# Patient Record
Sex: Female | Born: 1955 | Race: White | Hispanic: No | Marital: Single | Smoking: Former smoker
Health system: Southern US, Community
[De-identification: ages and names within clinical notes are randomized; demographics above are authoritative.]

## PROBLEM LIST (undated history)

## (undated) DIAGNOSIS — I219 Acute myocardial infarction, unspecified: Secondary | ICD-10-CM

## (undated) DIAGNOSIS — G4730 Sleep apnea, unspecified: Secondary | ICD-10-CM

## (undated) DIAGNOSIS — R42 Dizziness and giddiness: Secondary | ICD-10-CM

## (undated) DIAGNOSIS — J189 Pneumonia, unspecified organism: Secondary | ICD-10-CM

## (undated) DIAGNOSIS — K760 Fatty (change of) liver, not elsewhere classified: Secondary | ICD-10-CM

## (undated) DIAGNOSIS — J449 Chronic obstructive pulmonary disease, unspecified: Secondary | ICD-10-CM

## (undated) DIAGNOSIS — I10 Essential (primary) hypertension: Secondary | ICD-10-CM

## (undated) DIAGNOSIS — J3089 Other allergic rhinitis: Secondary | ICD-10-CM

## (undated) DIAGNOSIS — M1990 Unspecified osteoarthritis, unspecified site: Secondary | ICD-10-CM

## (undated) DIAGNOSIS — K2970 Gastritis, unspecified, without bleeding: Secondary | ICD-10-CM

## (undated) DIAGNOSIS — G4733 Obstructive sleep apnea (adult) (pediatric): Secondary | ICD-10-CM

## (undated) DIAGNOSIS — Z8719 Personal history of other diseases of the digestive system: Secondary | ICD-10-CM

## (undated) DIAGNOSIS — F419 Anxiety disorder, unspecified: Secondary | ICD-10-CM

## (undated) DIAGNOSIS — D539 Nutritional anemia, unspecified: Secondary | ICD-10-CM

## (undated) DIAGNOSIS — L0390 Cellulitis, unspecified: Secondary | ICD-10-CM

## (undated) DIAGNOSIS — Z9989 Dependence on other enabling machines and devices: Secondary | ICD-10-CM

## (undated) DIAGNOSIS — K449 Diaphragmatic hernia without obstruction or gangrene: Secondary | ICD-10-CM

## (undated) DIAGNOSIS — F32A Depression, unspecified: Secondary | ICD-10-CM

## (undated) DIAGNOSIS — G43909 Migraine, unspecified, not intractable, without status migrainosus: Secondary | ICD-10-CM

## (undated) DIAGNOSIS — I2510 Atherosclerotic heart disease of native coronary artery without angina pectoris: Secondary | ICD-10-CM

## (undated) DIAGNOSIS — D649 Anemia, unspecified: Secondary | ICD-10-CM

## (undated) DIAGNOSIS — E119 Type 2 diabetes mellitus without complications: Secondary | ICD-10-CM

## (undated) DIAGNOSIS — R0602 Shortness of breath: Secondary | ICD-10-CM

## (undated) DIAGNOSIS — J45909 Unspecified asthma, uncomplicated: Secondary | ICD-10-CM

## (undated) DIAGNOSIS — K649 Unspecified hemorrhoids: Secondary | ICD-10-CM

## (undated) DIAGNOSIS — K219 Gastro-esophageal reflux disease without esophagitis: Secondary | ICD-10-CM

## (undated) DIAGNOSIS — M109 Gout, unspecified: Secondary | ICD-10-CM

## (undated) DIAGNOSIS — I209 Angina pectoris, unspecified: Secondary | ICD-10-CM

## (undated) DIAGNOSIS — I639 Cerebral infarction, unspecified: Secondary | ICD-10-CM

## (undated) DIAGNOSIS — F329 Major depressive disorder, single episode, unspecified: Secondary | ICD-10-CM

## (undated) DIAGNOSIS — E785 Hyperlipidemia, unspecified: Secondary | ICD-10-CM

## (undated) HISTORY — PX: ERCP: SHX60

## (undated) HISTORY — PX: UPPER GI ENDOSCOPY: SHX6162

## (undated) HISTORY — DX: Atherosclerotic heart disease of native coronary artery without angina pectoris: I25.10

## (undated) HISTORY — PX: KNEE ARTHROSCOPY W/ MENISCECTOMY: SHX1879

## (undated) HISTORY — PX: DILATION AND CURETTAGE OF UTERUS: SHX78

## (undated) HISTORY — PX: SEPTOPLASTY: SUR1290

## (undated) HISTORY — DX: Obstructive sleep apnea (adult) (pediatric): G47.33

## (undated) HISTORY — PX: TONSILLECTOMY AND ADENOIDECTOMY: SUR1326

## (undated) HISTORY — DX: Dependence on other enabling machines and devices: Z99.89

## (undated) HISTORY — PX: CHOLECYSTECTOMY: SHX55

## (undated) HISTORY — PX: NASAL SINUS SURGERY: SHX719

## (undated) HISTORY — PX: DILATION AND CURETTAGE, DIAGNOSTIC / THERAPEUTIC: SUR384

---
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
## 1979-02-20 DIAGNOSIS — I219 Acute myocardial infarction, unspecified: Secondary | ICD-10-CM

## 1979-02-20 HISTORY — DX: Acute myocardial infarction, unspecified: I21.9

## 1979-02-20 HISTORY — PX: TONSILLECTOMY AND ADENOIDECTOMY: SUR1326

## 1989-02-19 HISTORY — PX: SEPTOPLASTY: SUR1290

## 1997-01-02 HISTORY — PX: CORONARY ARTERY BYPASS GRAFT: SHX141

## 1998-02-25 ENCOUNTER — Encounter: Payer: Self-pay | Admitting: Cardiovascular Disease

## 1998-02-25 ENCOUNTER — Observation Stay (HOSPITAL_COMMUNITY): Admission: AD | Admit: 1998-02-25 | Discharge: 1998-02-26 | Payer: Self-pay | Admitting: Cardiovascular Disease

## 1998-03-17 HISTORY — PX: CORONARY ANGIOPLASTY WITH STENT PLACEMENT: SHX49

## 1998-12-31 ENCOUNTER — Observation Stay (HOSPITAL_COMMUNITY): Admission: AD | Admit: 1998-12-31 | Discharge: 1999-01-01 | Payer: Self-pay | Admitting: Cardiovascular Disease

## 1998-12-31 HISTORY — PX: CARDIAC CATHETERIZATION: SHX172

## 2001-07-21 HISTORY — PX: CARDIAC CATHETERIZATION: SHX172

## 2002-09-24 ENCOUNTER — Encounter: Payer: Self-pay | Admitting: Cardiology

## 2002-09-24 ENCOUNTER — Inpatient Hospital Stay (HOSPITAL_COMMUNITY): Admission: AD | Admit: 2002-09-24 | Discharge: 2002-09-26 | Payer: Self-pay | Admitting: Cardiology

## 2002-09-25 HISTORY — PX: CARDIAC CATHETERIZATION: SHX172

## 2002-09-26 ENCOUNTER — Encounter: Payer: Self-pay | Admitting: Cardiology

## 2004-07-16 ENCOUNTER — Ambulatory Visit: Payer: Self-pay

## 2004-08-28 ENCOUNTER — Inpatient Hospital Stay: Payer: Self-pay

## 2004-11-20 HISTORY — PX: CARDIAC CATHETERIZATION: SHX172

## 2005-06-09 ENCOUNTER — Ambulatory Visit: Payer: Self-pay

## 2005-10-07 ENCOUNTER — Inpatient Hospital Stay (HOSPITAL_COMMUNITY): Admission: EM | Admit: 2005-10-07 | Discharge: 2005-10-09 | Payer: Self-pay | Admitting: Cardiovascular Disease

## 2005-10-07 ENCOUNTER — Emergency Department: Payer: Self-pay | Admitting: Emergency Medicine

## 2005-10-08 HISTORY — PX: CARDIAC CATHETERIZATION: SHX172

## 2005-10-19 ENCOUNTER — Ambulatory Visit (HOSPITAL_COMMUNITY): Admission: RE | Admit: 2005-10-19 | Discharge: 2005-10-20 | Payer: Self-pay | Admitting: Cardiovascular Disease

## 2005-10-19 HISTORY — PX: CORONARY ANGIOPLASTY WITH STENT PLACEMENT: SHX49

## 2005-12-21 ENCOUNTER — Ambulatory Visit (HOSPITAL_COMMUNITY): Admission: RE | Admit: 2005-12-21 | Discharge: 2005-12-21 | Payer: Self-pay | Admitting: Cardiology

## 2005-12-21 HISTORY — PX: CARDIAC CATHETERIZATION: SHX172

## 2006-07-20 ENCOUNTER — Ambulatory Visit: Payer: Self-pay

## 2007-01-03 ENCOUNTER — Ambulatory Visit: Payer: Self-pay

## 2007-05-23 ENCOUNTER — Ambulatory Visit: Payer: Self-pay | Admitting: General Practice

## 2007-07-10 ENCOUNTER — Ambulatory Visit: Payer: Self-pay | Admitting: General Practice

## 2007-11-28 ENCOUNTER — Ambulatory Visit: Payer: Self-pay

## 2008-01-04 ENCOUNTER — Inpatient Hospital Stay (HOSPITAL_COMMUNITY): Admission: AD | Admit: 2008-01-04 | Discharge: 2008-01-05 | Payer: Self-pay | Admitting: Cardiovascular Disease

## 2008-01-04 HISTORY — PX: CARDIAC CATHETERIZATION: SHX172

## 2008-07-16 ENCOUNTER — Emergency Department (HOSPITAL_COMMUNITY): Admission: EM | Admit: 2008-07-16 | Discharge: 2008-07-16 | Payer: Self-pay | Admitting: Emergency Medicine

## 2008-10-23 DIAGNOSIS — I2510 Atherosclerotic heart disease of native coronary artery without angina pectoris: Secondary | ICD-10-CM

## 2008-10-23 HISTORY — DX: Atherosclerotic heart disease of native coronary artery without angina pectoris: I25.10

## 2008-12-02 ENCOUNTER — Ambulatory Visit: Payer: Self-pay | Admitting: Internal Medicine

## 2009-02-13 ENCOUNTER — Ambulatory Visit: Payer: Self-pay | Admitting: Gastroenterology

## 2009-02-21 ENCOUNTER — Ambulatory Visit: Payer: Self-pay | Admitting: Gastroenterology

## 2009-02-28 ENCOUNTER — Other Ambulatory Visit: Payer: Self-pay | Admitting: Gastroenterology

## 2009-05-22 ENCOUNTER — Ambulatory Visit: Payer: Self-pay

## 2009-07-05 ENCOUNTER — Emergency Department: Payer: Self-pay | Admitting: Emergency Medicine

## 2009-08-15 ENCOUNTER — Ambulatory Visit: Payer: Self-pay | Admitting: Emergency Medicine

## 2009-10-06 ENCOUNTER — Ambulatory Visit: Payer: Self-pay | Admitting: Cardiovascular Disease

## 2009-10-07 ENCOUNTER — Other Ambulatory Visit: Payer: Self-pay | Admitting: Cardiovascular Disease

## 2009-10-13 ENCOUNTER — Ambulatory Visit (HOSPITAL_COMMUNITY): Admission: RE | Admit: 2009-10-13 | Discharge: 2009-10-14 | Payer: Self-pay | Admitting: Cardiovascular Disease

## 2009-10-13 HISTORY — PX: CARDIAC CATHETERIZATION: SHX172

## 2010-01-21 ENCOUNTER — Ambulatory Visit: Payer: Self-pay | Admitting: Surgery

## 2010-01-22 LAB — PATHOLOGY REPORT

## 2010-03-04 ENCOUNTER — Ambulatory Visit: Payer: Self-pay | Admitting: Surgery

## 2010-03-19 ENCOUNTER — Ambulatory Visit: Payer: Self-pay | Admitting: Gastroenterology

## 2010-05-21 ENCOUNTER — Other Ambulatory Visit: Payer: Self-pay | Admitting: Gastroenterology

## 2010-05-26 ENCOUNTER — Ambulatory Visit: Payer: Self-pay | Admitting: Gastroenterology

## 2010-07-09 ENCOUNTER — Ambulatory Visit: Payer: Self-pay

## 2010-08-19 ENCOUNTER — Inpatient Hospital Stay: Payer: Self-pay | Admitting: Internal Medicine

## 2010-09-10 ENCOUNTER — Other Ambulatory Visit: Payer: Self-pay | Admitting: Radiology

## 2010-09-11 ENCOUNTER — Ambulatory Visit: Payer: Self-pay | Admitting: Neurology

## 2010-10-05 LAB — POCT CARDIAC MARKERS
CKMB, poc: <1 ng/mL — ABNORMAL LOW (ref 1.0–8.0)
CKMB, poc: <1 ng/mL — ABNORMAL LOW (ref 1.0–8.0)
Myoglobin, poc: 63.5 ng/mL (ref 12–200)
Troponin i, poc: <0.05 ng/mL (ref 0.00–0.09)
Troponin i, poc: <0.05 ng/mL (ref 0.00–0.09)

## 2010-10-05 LAB — BASIC METABOLIC PANEL
BUN: 6 mg/dL (ref 6–23)
CO2: 29 mEq/L (ref 19–32)
Calcium: 8 mg/dL — ABNORMAL LOW (ref 8.4–10.5)
Chloride: 96 mEq/L (ref 96–112)
Creatinine, Ser: 0.78 mg/dL (ref 0.4–1.2)
GFR calc Af Amer: >60 mL/min (ref >60)
GFR calc non Af Amer: >60 mL/min (ref >60)
Glucose, Bld: 124 mg/dL — ABNORMAL HIGH (ref 70–99)
Potassium: 2.5 mEq/L — CL (ref 3.5–5.1)
Sodium: 137 mEq/L (ref 135–145)

## 2010-10-05 LAB — CBC
HCT: 38 % (ref 36.0–46.0)
Hemoglobin: 13.1 g/dL (ref 12.0–15.0)
MCHC: 34.5 g/dL (ref 30.0–36.0)
MCV: 94.9 fL (ref 78.0–100.0)
Platelets: 221 10*3/uL (ref 150–400)
RBC: 4.01 MIL/uL (ref 3.87–5.11)
RDW: 13.3 % (ref 11.5–15.5)
WBC: 8.3 10*3/uL (ref 4.0–10.5)

## 2010-10-05 LAB — DIFFERENTIAL
Basophils Relative: 1 % (ref 0–1)
Eosinophils Relative: 3 % (ref 0–5)
Monocytes Absolute: 0.6 10*3/uL (ref 0.1–1.0)
Monocytes Relative: 7 % (ref 3–12)
Neutro Abs: 5.9 10*3/uL (ref 1.7–7.7)

## 2010-11-03 NOTE — Discharge Summary (Signed)
Rita Morgan, Rita [name]:  [id number]   MEDICAL RECORD NO.:  [id number]          PATIENT TYPE:  INP   LOCATION:  3735                         FACILITY:  [facility]   PHYSICIAN:  Nanetta Batty, M.D.   DATE OF BIRTH:  10/06/1955   DATE OF ADMISSION:  01/04/2008  DATE OF DISCHARGE:  01/05/2008                               DISCHARGE SUMMARY   DISCHARGE DIAGNOSES:  1. Chest discomfort with abnormal Myoview and this cardiac      catheterization revealed patent grafts and patent stents,      noncardiac source of chest pain most likely.  2. Coronary artery disease with history of coronary artery bypass      grafting in 99 with LIMA to the diagonal, vein graft to the RCA and      interventions since that time.  3. Severe gastroesophageal reflux disease.  4. Hypertension.  5. Hyperlipidemia.  6. Chronic obstructive pulmonary disease with reactive airway disease.  7. Obstructive sleep apnea, no longer wears a continuous positive      airway pressure.   DISCHARGE CONDITION:  Improved.   PROCEDURES:  Combined left heart catheterization with graft  visualization on January 05, 2008, revealing patent vessels.   ALLERGIES:  CARDIZEM, TROVAN, and PREDNISONE.   DISCHARGE MEDICATIONS:  1. Toprol-XL 100 mg daily.  2. Prevacid 30 mg every 12 hours.  3. Plavix 75 mg daily.  4. Aspirin 81 mg daily.  5. Torsemide 40 mg every morning.  6. KCl 20 mEq twice a day.  7. Lipitor 80 mg daily.  8. Zoloft 100 mg daily at bedtime.  9. Singulair 10 mg at bedtime.  10.Advair 250/50 inhalations every 12 hours.  11.Atrovent 2 puffs p.r.n. every 4 hours.  12.Albuterol inhaler 2 puffs q. 4 h. p.r.n.  13.Lovaza 2 g twice a day.  14.Multivitamin daily.  15.Creon 10 four capsules before meals and at bedtime.  16.Alprazolam 0.5 mg q.8 h. p.r.n.  17.Ibuprofen 800 mg p.o. q.8 h p.r.n.  18.Tramadol 50-100 mg every 6 hours.  19.Zolpidem 10 mg at bedtime p.r.n.  20.Diphenhydramine 50 mg q.4 h.  p.r.n.  21.Diphenoxylate and atropine every 4 hours p.r.n.  22.Loperamide 2 mg q.4 h. p.r.n.  23.Promethazine 25 mg every 4 hours p.r.n.  24.Tylenol No. 3 one to two every 4 hours p.r.n.#  25.DuoNebs every 4 hours p.r.n..   DISCHARGE INSTRUCTIONS:  1. Increase activity slowly.  May shower or bathe.  No lifting for 2      days.  No driving for 2 days.  May shower beginning on January 06, 2008.  2. Low-sodium heart-healthy diet.  3. Wash cath site with soap and water.  Call if any bleeding, swelling      or drainage.  4. Follow up with Dr. Allyson Sabal January 23, 2008, at 2:00 p.m.  5. Follow up with Dr. Hyacinth Meeker for further chest discomfort.  6. Follow up with Dr. Kinnie Scales.   HISTORY OF PRESENT ILLNESS:  A 55 year old moderately overweight single  white female with history of coronary artery disease with grafting to a  course in  1998 multiple interventions with a stent to RCA vein graft  using Cypher drug-eluting stents in 1997.  Other problems are  hypertension, hyperlipidemia, obstructive sleep apnea, no longer on  CPAP.  She had a stress Myoview November 27, 2007, with subtle anterolateral  ischemia and diaphragmatic attenuation read at low risk but she had a  fairly severe episode of pain with diaphoresis in the last couple of  days with some mild chest pain since that time.   Review of systems, family history, social history, see Dr. Hazle Coca  notes.   PHYSICAL EXAM:  Heart, regular rate and rhythm.  Lungs were clear.  Groin cath site stable.  No hematoma.   LABORATORY DATA:  Currently not available.   HOSPITAL COURSE:  Rita Morgan was admitted by Dr. Allyson Sabal secondary to  abnormal nuclear study and an episode of significant chest pain with  diaphoresis and continued mild chest discomfort.  She was admitted to  [facility] with IV heparin and underwent diagnostic angiography January 05, 2008,  by Dr. Elsie Lincoln.  Fortunately, the cath looks good and it was felt she  could be discharged once she  recovered from post cath orders such as  lying flat.  She will follow up with Dr. Hazle Coca recommendations to see  primary care and Dr. Kinnie Scales for further chest discomfort.      Darcella Gasman. Annie Paras, N.P.       ______________________________  Nanetta Batty, M.D.    LRI/MEDQ  D:  01/05/2008  T:  01/06/2008  Job:  [id number]   cc:   Bethann Punches

## 2010-11-03 NOTE — Op Note (Signed)
Rita Morgan, Rita Morgan               ACCOUNT NO.:  [id number]   MEDICAL RECORD NO.:  [id number]          PATIENT TYPE:  INP   LOCATION:  3735                         FACILITY:  [facility]   PHYSICIAN:  Madaline Savage, M.D.DATE OF BIRTH:  1956/06/10   DATE OF PROCEDURE:  01/05/2008  DATE OF DISCHARGE:  01/05/2008                               OPERATIVE REPORT   PROCEDURES PERFORMED:  1. Selective coronary angiography by Judkins' technique.  2. Retrograde left heart catheterization.  3. Left ventricular angiography.  4. Selective visualization of a right internal mammary artery graft.  5. Selective visualization of one saphenous vein bypass graft.   INTERVENTIONS:  None.   COMPLICATIONS:  None.   PATIENT PROFILE:  The patient is a 55 year old obese female with a  history of coronary artery disease and bypass grafting in 1998 with  subsequent multiple coronary interventions.  One was to her right  coronary artery vein graft using a Cypher drug-eluting stent, which was  done in May 2007.  She has a history of hypertension, hyperlipidemia,  and obstructive sleep apnea.  Dr. Nanetta Batty, her primary  cardiologist saw her recently and obtained a Myoview stress test, which  was done on November 27, 2007, and it showed subtle anterolateral ischemic  and diaphragmatic continuation, which was read as low risk.  The patient  had a fairly severe episode of chest pain with diaphoresis in the last  couple of days and some mid chest pain since that time and therefore the  current cardiac catheterization was completed.  No complications  occurred.  It was done by the right percutaneous femoral approach using  5-French diagnostic catheters.   RESULTS:  Pressures:  The left ventricular pressure was a 110/65 with a  mean of 85.  The left ventricular pressure was 110/7 with an end-  diastolic  pressure of 16.  There was no significant aortic valve  gradient via the pull-back technique.   ANGIOGRAPHIC  RESULTS:  The patient's left main coronary artery was  medium in length, showed no evidence of calcification, and no evidence  of stenosis.  The left anterior descending coronary artery was close to  the cardiac apex giving rise to 1 major septal perforator branch and 2  tiny diagonal branches.  There was a third diagonal branch, which had a  takeoff well beyond the first septal perforator branch and that vessel  is essentially occluded, but as will be described later, it has a patent  free right internal mammary artery graft inserted into it.  The LAD  itself contains no significant lesions, but is a relatively small  vessel.   The left circumflex coronary artery is a somewhat dominant vessel  showing some lumpy-bumpy irregularities and ectasia in the proximal  segment of the vessel.  There is an ongoing circumflex that courses  through the atrioventricular groove then giving rise to a posterolateral  branch, and there are only lumpy-bumpy irregularities throughout the  course of the proximal circumflex and its mid and distal circumflex.  There is a very large and dominant obtuse marginal branch that contains  a  radiopaque stent just in the proximal portion of its takeoff from the  circumflex, and this vessel gives rise to 4 distal branches, all of  which appear to be patent.  As previously stated, the stent located  approximately in this obtuse marginal branch is widely patent.   The right coronary artery is a severely diseased vessel showing impaired  flow from the ostium down to the mid portion of vessel.  There are 2  different radiopaque stents, which overlap the ostium of the RCA and  then the proximal RCA and then the vessel itself becomes occluded well  after the 2 patent stents that have about a 50% in-stent restenosis.   There is an adjacent saphenous vein graft marker and a saphenous vein  graft that is widely patent showing lumpy-bumpy irregularities in the  mid portion of  the vein graft, but shows good anastomotic site  morphology with good flow into a posterior descending branch.   Finally, there is a free right internal mammary artery that has multiple  surgical clips on it that is sewn into the diagonal, which arises mid  way down the LAD, that RIMA graft and its recipient diagonal is widely  patent with good flow measured as TIMI 3.  No lesions were seen.   The left ventricular angiography performed in a 30-degree RAO projection  shows no evidence of mitral regurgitation, shows good wall motion in all  views, and ascending aorta is unremarkable.   FINAL IMPRESSIONS:  1. Recent chest pain.  Recent Myoview showing some subtle changes.  2. Normal left ventricular systolic function by left ventricular      angiography.  3. Normal left main coronary artery.  4. Left anterior descending artery with patent stent located in the      mid portion of the left anterior descending artery with good distal      flow.  5. Occluded diagonal proximally, but a free right internal mammary      artery that inserts into the diagonal is widely patent.  6. Lumpy-bumpy irregularities in a dominant circumflex vessel with an      obtuse marginal branch containing a patent stent proximally.  7. Severe disease involving the right coronary artery, which is      occluded mid way down the vessel, and also showing 50% in-stent      restenosis at the ostium of the right coronary artery and in the      proximal right coronary artery.  8. Patent saphenous vein graft to the distal right coronary artery.      There is also collateral flow from left anterior descending artery      to distal right coronary artery.   PLAN:  The patient is to be reassured about her anatomic findings.  She  will be a candidate for discharge later today.  She will follow up with  the excellent care of Dr. Nanetta Batty at [facility].            ______________________________  Madaline Savage, M.D.     WHG/MEDQ  D:  01/05/2008  T:  01/06/2008  Job:  [id number]   cc:   Nanetta Batty, M.D.  Fax: [phone]   [facility]

## 2010-11-06 NOTE — Cardiovascular Report (Signed)
NAMEALGA, SOUTHALL NO.:  [id number]   MEDICAL RECORD NO.:  [id number]          PATIENT TYPE:  OIB   LOCATION:  2899                         FACILITY:  [facility]   PHYSICIAN:  Thereasa Solo. Little, M.D. DATE OF BIRTH:  1955-08-19   DATE OF PROCEDURE:  12/21/2005  DATE OF DISCHARGE:                              CARDIAC CATHETERIZATION   INDICATION FOR TEST:  This 55 year old female had bypass surgery.  She on  Oct 19, 2005 had a stent placed to the saphenous vein graft to her RCA.  She  presented to the office on December 20, 2005 after having prolonged episode of  chest pain relieved with nitrates.  She is brought in for outpatient cardiac  catheterization.   DESCRIPTION OF PROCEDURE:  After obtaining informed consent the patient was  prepped and draped in the usual sterile fashion exposing the right groin.  Applying local anesthetic with 1% Xylocaine with Seldinger technique  employed, a 5-French introducer sheath was placed in the right femoral  artery.  Left and right coronary arteriography, graft visualization x2 and  ventriculography in the RAO projection was performed.   COMPLICATIONS:  None.   EQUIPMENT:  Five [place] Judkins configuration catheters and all grafts were  visualized with a JR-4 catheter.   HEMODYNAMIC MONITORING:  Central aortic pressure was 112/66.  Left  ventricular pressure was 116/2 and there was no valve gradient noted at the  time of pullback.   VENTRICULOGRAPHY:  Ventriculography in the RAO projection revealed normal LV  systolic function.  Ejection fraction greater than 55%.  The left  ventricular end-diastolic pressure was 12.   CORONARY ARTERIOGRAPHY:  1.  Left main normal.  It bifurcated.  2.  LAD.  This was a small vessel free of disease.  The diagonal vessel was      not visualized but was grafted.  3.  Circumflex.  The circumflex gave rise to 2 OM vessels which were free of      disease.  The mid circumflex had 30-40% areas of  narrowing and this is      unchanged from her last cath October 08, 2005.  4.  RCA.  The RCA is diffusely diseased.  The stent in the proximal portion      has high-grade stenoses.  This is also unchanged from her October 08, 2005      cath.   GRAFTS:  1.  The free right internal mammary artery to the diagonal was widely      patent.  The diagonal itself was widely patent.  2.  Saphenous vein graft to the RCA.  The graft was widely patent.  The      stent in the midportion was widely patent.  The PDA was well visualized      and free of disease.   CONCLUSION:  1.  Normal left ventricular systolic function.  2.  Patent grafts with the stent in the saphenous vein graft to the      posterior descending widely patent.  3.  Mild disease in her circumflex which is unchanged and a diffusely  diseased right coronary artery that is grafted.           ______________________________  Thereasa Solo. Little, M.D.     ABL/MEDQ  D:  12/21/2005  T:  12/21/2005  Job:  [id number]

## 2010-11-06 NOTE — Cardiovascular Report (Signed)
NAME:  Rita Morgan, Rita Morgan                         ACCOUNT NO.:  [id number]   MEDICAL RECORD NO.:  [id number]                   PATIENT TYPE:  INP   LOCATION:  2003                                 FACILITY:  [facility]   PHYSICIAN:  Madaline Savage, M.D.             DATE OF BIRTH:  23-Jan-1956   DATE OF PROCEDURE:  09/25/2002  DATE OF DISCHARGE:                              CARDIAC CATHETERIZATION   PROCEDURES PERFORMED:  1. Selective coronary angiography via the native coronary circulation.  2. Retrograde left heart catheterization.  3. Left ventriculography.  4. Aortic root angiography.  5. Selective visualization of a free right internal mammary graft to     diagonal and a saphenous vein graft to right coronary artery.   COMPLICATIONS:  None.   ENTRY SITE:  Right femoral.   DYE USED:  Omnipaque.   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 Allyson Sabal.  She has  had bypass grafting in the past and has had a stent placed to her proximal  obtuse marginal branch of the circumflex coronary artery.  She has had  recurrence of angina recently and was admitted yesterday for unstable angina  that is nitrate responsive.  She has ruled out for myocardial infarction.  She enters the catheterization lab to re-evaluate coronary anatomy in view  of symptoms.   RESULTS:  PRESSURES:  The left ventricular pressure was 94/3, end-diastolic  pressure 13.  Central aortic pressure was 94/64, with a mean of 100.  These  pressures were obtained with the patient on 6 mL of IV nitroglycerin an  hour.   ANGIOGRAPHIC RESULTS:  1. The left main coronary artery is medium in length, large in     circumference, and widely patent.  2. The left anterior descending coronary artery courses through the cardiac     apex and has minimal luminal irregularities, but is widely patent     throughout its course.  One large septal  perforator branch is also     normal.  There appears to be an occlusion of a diagonal branch, possibly     a second diagonal at its takeoff from the LAD.  3. The left circumflex coronary is a vessel of large circumference which     contains a very large obtuse marginal branch.  That OM branch bifurcates     distally.  It has a patent stent which appears well deployed and widely     patent throughout the proximal circumflex.  The atrioventricular groove     circumflex bifurcates distally.  The vessel tapers abruptly distally but     no lesions are seen.  4. The right coronary artery contains a radioopaque stent proximal and this     stent extends from the ostium down to a branch of the pulmonary conus     branch of  the RCA.  This stent is basically patent.  There may be some     mild in-stent restenosis, but it is insignificant.  The RCA is 100%     occluded in its mid portion after an acute marginal branch.  5. The vein graft to the RCA is widely patent throughout, and it touches     down to the mid posterior descending branch.  There is good flow distally     in the PDA and proximally, and we note filling of the PLA as well.  6. The free right internal mammary artery graft to the diagonal branch of     the LAD is widely patent throughout its course, and runoff is good.     There are some luminal irregularities of the ostium of this graft but     none of any significance.  7. The left ventricular angiogram shows good contractility of the ventricle     with an ejection fraction estimate of 55% to 60% with no mitral     regurgitation.  8. Aortic root angiography shows a smooth contour to the ascending aorta and     the descending aorta as well.   FINAL DIAGNOSES:  1. Coronary artery disease, status post bypass grafting.     A. Patent right saphenous vein graft to posterior descending artery.     B. Patent free right internal mammary artery graft to diagonal.  2. Normal left ventricular  systolic function.  3. A 100% occlusion of mid RCA and 100% occlusion of the proximal second     diagonal.  4. Normal aortic root arteriography.   RECOMMENDATIONS:  The patient's chest pain is nitrate responsive and is  clinically angina, however, no epicardial coronary artery disease or graft  disease is found to explain those symptoms.  Possibilities include  microvascular angina or possible GI pathology.                                               Madaline Savage, M.D.    WHG/MEDQ  D:  09/25/2002  T:  09/26/2002  Job:  [id number]   cc:   Nanetta Batty, M.D.  [address], [address]  [place]  [place] [address]  Fax: [phone]   Cath Lab at [facility]

## 2010-11-06 NOTE — Cardiovascular Report (Signed)
NAMEISAAC, [name]:  [id number]   MEDICAL RECORD NO.:  [id number]          PATIENT TYPE:  INP   LOCATION:  2918                         FACILITY:  [facility]   PHYSICIAN:  Rita Priestly, MD  DATE OF BIRTH:  1956-05-02   DATE OF PROCEDURE:  10/08/2005  DATE OF DISCHARGE:                              CARDIAC CATHETERIZATION   PROCEDURES:  1.  Left heart catheterization.  2.  Coronary angiography.  3.  Left ventriculogram.  4.  Saphenous graft angiography.  5.  Right internal mammary angiography.   CARDIOLOGIST:  Lenise Herald, M.D.   COMPLICATIONS:  None.   INDICATIONS FOR PROCEDURE:  Rita Morgan is a 55 year old female patient of  Dr. Bethann Punches, [facility] in [place], Dr. Nanetta Batty with a  history of hypertension, hyperlipidemia, history of coronary artery disease  status post stenting of the LAD circumflex and RCA with ultimate InStent  restenosis of the RCA and diffuse disease of the diagonal. She ultimately  underwent bypass surgery by Dr. Kathlee Nations Trigt, consisting of vein graft to  the PDA as well as a free RIMA to diagonal. She was re-ad[facility] to [facility] on October 07, 2005 with recurrent chest pain with lateral T  wave inversions. She is now brought for repeat catheterization and to rule  out significant coronary artery disease. She did rule out for myocardial  infarction with troponin and CK monitoring.   DESCRIPTION OF PROCEDURE:  After informed written consent, the patient was  brought to the cardiac catheterization lab where her right and left groins  were shaved, prepped, and draped in the usual sterile fashion.  __________.  Using modified Seldinger technique, a #6 French arterial sheath was inserted  into the right femoral artery. A 6 French diagnostic catheter was used to  perform diagnostic angiography.   The left main was a large vessel with no significant disease.   The LAD is a large vessel  that coarse to the apex andgives rise to one  totally occluded diagonal vessel. There is a stent noted in the early mid  portion of the LAD, which appears to be widely patent. There is mild 50%  narrowing of the LAD prior to the LAD stent. The remainder of the LAD has no  significant disease.   The first diagonal is totally occluded in its proximal portion. There is a  free RIMA anastomosed to the mid portion of the diagonal. There is pressure  damping when engaged in the RIMA, however, there does not appear to be any  significant disease within the body of the graft or distal graft insertion.   The left circumflex is a large vessel that courses in the AV groove and give  rise two obtuse marginal branches. The AV groove circumflex is noted to have  sequential 30% lesions in its proximal early mid segment. The remainder of  the AV groove circumflex is small and has no significant disease.   The first OM is a medium to large size vessel, which bifurcates distally.  There is a stent noted in the proximal portion  of the OM, which appears to  be widely patent.   The second OM is a small vessel with no significant disease.   The right coronary artery is a small vessel, which has 2 to 3 stents  overlapping its proximal segment with diffuse InStent restenosis. There is  sequential 99% lesions with a sub-totaled RCA. There is faint filling into  the PDA and posterolateral branch with competitive flow.   The vein graft to the PDA is patent, though there appears to be a long  lesion in the mid portion of the graft, up to 70% throughout the entire mid  segment of the graft. There does not appear to be any significant disease at  the anastomosis or distal to the graft insertion.   The left ventriculogram has a preserved EF of 60%.   HEMODYNAMIC SYSTEMS:  Systemic arterial pressure 106/63, LV systolic  pressure 106/11, LV LVDP 23.   CONCLUSION:  1.  Significant 2 vessel coronary artery  disease.  2.  Patent free right internal mammary artery to the diagonal with no      significant disease in the body of the graft or distal graft insertion.  3.  Patent vein graft to the patent ductus arteriosus with long diffuse 60%      to 70% disease within the mid portion of the graft.  4.  Normal left ventricular systolic function.  5.  Elevated left ventricular pressure.      Rita Priestly, MD  Electronically Signed     RHM/MEDQ  D:  10/08/2005  T:  10/09/2005  Job:  [id number]

## 2010-11-06 NOTE — Discharge Summary (Signed)
NAME:  Rita Morgan, Rita [name]:  [id number]   MEDICAL RECORD NO.:  [id number]                   PATIENT TYPE:  INP   LOCATION:  2003                                 FACILITY:  [facility]   PHYSICIAN:  Madaline Savage, M.D.             DATE OF BIRTH:  10/20/1955   DATE OF ADMISSION:  09/24/2002  DATE OF DISCHARGE:  09/26/2002                                 DISCHARGE SUMMARY   The patient is a 55 year old white female patient of Dr. Nanetta Batty with  a primary medical history of coronary artery disease.  She underwent  coronary artery bypass graft x2 in 7/98 and subsequent stent of her LAD and  circumflex.  Last Cardiolite was 07/26/01.  No ischemia was noted.  She  apparently has developed significant increased chest discomfort, angina  symptoms.  She was taking nitroglycerin to decrease the pain, but she was  not totally pain free.  Thus, it was decided that she should be admitted to  hospital and  put on IV nitroglycerin and IV heparin and she should undergo  cardiac catheterization.  She was seen by Dr. Elsie Lincoln apparently in the  office.  She continued to have chest pain on IV nitroglycerin and heparin.  However, it did respond to increase in nitroglycerin. On 09/25/02, she  underwent cardiac catheterization which showed a free RIMA to a diagonal  that was patent.  Her circumflex was patent.  Her saphenous vein graft to  RCA was patent.  She had 100% RCA.  Proximal RCA stent was patent.  Thus,  a  GI consult was called.  She underwent gallbladder ultrasound that was  apparently negative.  The plan was that she should have a HIDA scan.  This  would be d one as an outpatient because she was considered stable on 09/26/02  for discharge home.  Her blood pressure was 104/63 and 94/74, heart rate 60,  respirations 22, temperature 98.7.   LABS ON DISCHARGE:  Hemoglobin 12.7, hematocrit 37.1.  WBC 9.4, platelets  227.  Sodium 137, potassium 3.6, BUN 7, creatinine  0.8.  Her glucose was 97.  Her right groin was without bruits or hematoma.   OTHER LABS:  TSH was 2.643. CK-MB was negative x3 with troponin negative x3.  AST 20, ALT 18.  Ultrasound showed fatty liver, top normal caliber, common  bile duct no evidence of calculi in the gallbladder.  Chest x-ray showed no  active disease.   DISCHARGE DIAGNOSES:  1. Chest pain without any ischemia noted on cardiac catheterization.  2. Known arteriosclerotic cardiovascular disease with history of coronary     artery bypass graft x2.  3. Gastroesophageal reflux disease.  4. Hypertension.  5. Hyperlipidemia.  6. History of recent pancreatitis.  7. History of sleep apnea.  8. Hyperlipidemia.     Lezlie Octave, [place].P.  Madaline Savage, M.D.    BB/MEDQ  D:  10/24/2002  T:  10/26/2002  Job:  [id number]   cc:   Nanetta Batty, M.D.  [address], [address]  [place]  [place] [address]  Fax: [phone]   Petra Kuba, M.D.  [address], [address]  [place]  [place] [address]  Fax: [phone]   Griffith Citron, M.D.  [facility] [place]  [place] [address]  Fax: [phone]

## 2010-11-06 NOTE — Op Note (Signed)
Rita Morgan, ROYLANCE NO.:  [id number]   MEDICAL RECORD NO.:  [id number]          PATIENT TYPE:  OIB   LOCATION:  6533                         FACILITY:  [facility]   PHYSICIAN:  Nanetta Batty, M.D.   DATE OF BIRTH:  1956-03-09   DATE OF PROCEDURE:  10/19/2005  DATE OF DISCHARGE:                                 OPERATIVE REPORT   PROCEDURE:  PCI and stent.   Ms. Korpi is a 55 year old mildly overweight single white female with  CAD, status post multiple interventions in the past and ultimately coronary  artery bypass grafting in 1999, with a LIMA to a diagonal branch and a vein  graft to the RCA.  Her other problems include severe GERD, hypertension,  hyperlipidemia, COPD with reactive airways disease.  She was catheterized  approximately 1-2 weeks ago and found to have high-grade segmental mid-RCA  vein graft stenosis, but at that time declined intervention because of the  concern over Plavix.  She was seen in the office yesterday with continued  chest pain and presents now for intervention using Angiomax and drug-eluting  stent.   PROCEDURE DESCRIPTION:  The patient was brought to the second floor [facility] cardiac catheterization lab in the postabsorptive state.  She was  premedicated with p.o. Valium and IV Versed as well as IV Phenergan.  Her  right groin was prepped and shaved in the usual sterile fashion.  One  percent Xylocaine was used for local anesthesia.  A 6 French sheath was  inserted into the right femoral artery using standard Seldinger technique.  A 6 French sheath was then inserted into the right femoral vein.  The  patient was on aspirin and Plavix as an outpatient and received an  additional 150 mg of p.o. Plavix prior to intervention as well as Angiomax  bolus, with an ending ACT of 332.   Using a 6 [place] JR-4 guide along with an EZ FilterWire and a 2.5 x 28  Cypher drug-eluting stent, distal protecting was performed, and direct  stenting subsequent to that at 16 atmospheres (2.61 mm) for 30 seconds.  There was sluggish flow prior to removal of the filter.  The patient  received 200 mcg of intracoronary nitroglycerin several times during the  case, with restitution of brisk TIMI III flow.  There were no filling  defects in the distal right, and the patient remained hemodynamically and  clinically stable.  The FilterWire was then withdrawn using the retrieval  sheath, and completion angiography was performed.  The patient tolerated the  procedure well.  There were no hemodynamic or electrocardiographic sequelae.  The guide wire and catheter were removed, and the sheaths were  sewn securely in place.  The patient left the lab in stable condition.  The  sheaths will be removed in 2 hours.  She will remain supine for 6 hours  subsequent to that.  She will be treated with aspirin and Plavix and be  discharged home in the morning if she remains clinically stable overnight.  She left the lab in stable condition.      Christiane Ha  Allyson Sabal, M.D.  Electronically Signed     JB/MEDQ  D:  10/19/2005  T:  10/19/2005  Job:  [id number]   cc:   [facility]  Second Floor   [facility]  [address]  [place] [address]   Bethann Punches  Fax: [phone]

## 2010-11-06 NOTE — Discharge Summary (Signed)
Rita Morgan, Rita Morgan               ACCOUNT NO.:  [id number]   MEDICAL RECORD NO.:  [id number]          PATIENT TYPE:  INP   LOCATION:  4738                         FACILITY:  [facility]   PHYSICIAN:  Raymon Mutton, P.A. DATE OF BIRTH:  04-23-1956   DATE OF ADMISSION:  10/07/2005  DATE OF DISCHARGE:                                 DISCHARGE SUMMARY   DISCHARGE DIAGNOSES:  1.  Chest pain, unstable angina pectoris, status post catheterization during      this admission.  No intervention performed.  2.  Known coronary artery disease, status post coronary artery bypass graft.  3.  Hypertension.  4.  Hyperlipidemia.  5.  Gastroesophageal reflux disease.  6.  Sleep apnea.  7.  History of pancreatitis.   HOSPITAL COURSE:  This is a 55 year old Caucasian woman with a prior history  of coronary artery disease and coronary artery bypass graft surgery,  approximately eight years ago, who presented to the emergency room at  [facility] with complaints of chest pain and shortness of breath.  The symptoms started as back pain and then radiated to the front.  She took  nitroglycerin with relief of pain, but later, the pain returned at it was  mainly in the front with pressure and tightness and also, she experienced  left neck and jaw pain and shortness of breath.  She was started on heparin  and nitroglycerin drip in [facility] and transferred to [facility] for further evaluation.  Dr. Allyson Sabal saw the patient on admission.  She was admitted on rule-out MI protocol and she wanted her to undergo the  catheterization the next day.   HOSPITAL PROCEDURES:  Cardiac catheterization, performed on October 08, 2005,  done by Dr. Jenne Campus.  The cath showed diseased SVG to RCA, about 60 to 70%,  patent stents.  Also, 80% in-stent restenosis in the proximal RCA, followed  by a 99% median distal RCA stenosis, and the SVG was inserted beyond the  stenosis.  She has a patent stent in the obtuse  marginal and a patent stent  in the LAD, as well as patent pre RIMA to diagonal.   No intervention was performed during this catheterization.  Recommendations  were to continue with combined therapy of Plavix and aspirin.   The next morning, the patient was seen by Dr. Jenne Campus, and besides his  recommendation to stay, she insisted to be discharged home.   LABORATORY DATA:  BMP showed potassium of 3.9, sodium 142, chloride 105, CO2  31, BUN 4, creatinine 0.6, and glucose 108.  Her enzymes were negative x3.  Hemoglobin was 10.8, hematocrit 31.8.  Magnesium level 1.9.  TSH 3.408.  Cholesterol 164, triglycerides 202, LDL 87, HDL 37.  Hemoglobin A1c 6.4.   DISCHARGE INSTRUCTIONS:  The patient is to stay on a low-cholesterol, low-  carb diet.  No driving for three days.  No lifting for three days greater  than 5 pounds.  She was allowed to increase her activity, as tolerated.  She  has to follow up with Dr. Allyson Sabal, who will see the patient in the  office in  two to three weeks, and our office will contact the patient with a date of  time of appointment.   DISCHARGE MEDICATIONS:  Aspirin 81 mg q. day, Plavix 75 mg q. day, Toprol-XL  100 mg q. day, Zoloft 100 mg q. day, Singulair 10 mg q. day, Lipitor 80 mg  q. day, Protonix 40 mg q. day, nitroglycerin 0.4 mg sublingual p.r.n., Xanax  0.25 mg p.r.n., Demodex 20 mg q. day, and KCl 20 mEq q. day.      Raymon Mutton, P.A.     MK/MEDQ  D:  10/09/2005  T:  10/11/2005  Job:  [id number]   cc:   Dr. Bethann Punches

## 2011-03-19 LAB — TSH: TSH: 2.504

## 2011-03-19 LAB — COMPREHENSIVE METABOLIC PANEL
BUN: 2 — ABNORMAL LOW
Calcium: 9
Glucose, Bld: 98
Total Protein: 6.3

## 2011-03-19 LAB — HEPARIN LEVEL (UNFRACTIONATED): Heparin Unfractionated: 0.4

## 2011-03-19 LAB — DIFFERENTIAL
Lymphocytes Relative: 19
Lymphs Abs: 1.5
Monocytes Relative: 7
Neutro Abs: 5.8
Neutrophils Relative %: 72

## 2011-03-19 LAB — CBC
MCHC: 35.1
MCV: 95.8
Platelets: 248
RBC: 3.98
RDW: 13.4
WBC: 7.8

## 2011-03-19 LAB — BASIC METABOLIC PANEL
BUN: 5 — ABNORMAL LOW
GFR calc non Af Amer: >60
Potassium: 3 — ABNORMAL LOW

## 2011-03-19 LAB — CK TOTAL AND CKMB (NOT AT ARMC)
Relative Index: INVALID
Total CK: 67

## 2011-03-19 LAB — PROTIME-INR: INR: 1

## 2011-03-19 LAB — APTT: aPTT: 32

## 2011-04-15 ENCOUNTER — Ambulatory Visit: Payer: Self-pay | Admitting: Gastroenterology

## 2011-04-16 ENCOUNTER — Other Ambulatory Visit: Payer: Self-pay | Admitting: Podiatry

## 2011-10-11 ENCOUNTER — Ambulatory Visit: Payer: Self-pay | Admitting: General Practice

## 2011-10-20 ENCOUNTER — Ambulatory Visit: Payer: Self-pay | Admitting: General Practice

## 2011-11-15 ENCOUNTER — Ambulatory Visit: Payer: Self-pay | Admitting: Internal Medicine

## 2012-05-24 ENCOUNTER — Encounter (HOSPITAL_COMMUNITY): Payer: Self-pay | Admitting: Pharmacy Technician

## 2012-05-25 ENCOUNTER — Ambulatory Visit: Payer: Self-pay | Admitting: Cardiovascular Disease

## 2012-05-25 LAB — CBC WITH DIFFERENTIAL/PLATELET
Basophil #: 0.1 10*3/uL (ref 0.0–0.1)
Basophil %: 0.9 %
Eosinophil %: 2.8 %
HCT: 36.4 % (ref 35.0–47.0)
Lymphocyte #: 1.2 10*3/uL (ref 1.0–3.6)
Lymphocyte %: 21.2 %
Monocyte %: 9.5 %
Neutrophil #: 3.8 10*3/uL (ref 1.4–6.5)
Platelet: 221 10*3/uL (ref 150–440)
RBC: 3.92 10*6/uL (ref 3.80–5.20)
RDW: 12.5 % (ref 11.5–14.5)
WBC: 5.7 10*3/uL (ref 3.6–11.0)

## 2012-05-25 LAB — BASIC METABOLIC PANEL
Anion Gap: 6 — ABNORMAL LOW (ref 7–16)
BUN: 6 mg/dL — ABNORMAL LOW (ref 7–18)
Chloride: 104 mmol/L (ref 98–107)
Creatinine: 0.76 mg/dL (ref 0.60–1.30)
EGFR (Non-African Amer.): >60
Glucose: 113 mg/dL — ABNORMAL HIGH (ref 65–99)
Potassium: 4.1 mmol/L (ref 3.5–5.1)
Sodium: 139 mmol/L (ref 136–145)

## 2012-05-25 LAB — PROTIME-INR: INR: 0.9

## 2012-05-25 LAB — APTT: Activated PTT: 31.5 secs (ref 23.6–35.9)

## 2012-05-25 LAB — TSH: Thyroid Stimulating Horm: 2.89 u[IU]/mL

## 2012-05-29 ENCOUNTER — Other Ambulatory Visit: Payer: Self-pay | Admitting: Cardiovascular Disease

## 2012-05-31 ENCOUNTER — Encounter (HOSPITAL_COMMUNITY): Payer: Self-pay | Admitting: Cardiology

## 2012-05-31 ENCOUNTER — Ambulatory Visit (HOSPITAL_COMMUNITY)
Admission: RE | Admit: 2012-05-31 | Discharge: 2012-06-01 | Disposition: A | Discharge status: Home / Self Care | Payer: PRIVATE HEALTH INSURANCE | Source: Ambulatory Visit | Attending: Cardiovascular Disease | Admitting: Cardiovascular Disease

## 2012-05-31 ENCOUNTER — Encounter (HOSPITAL_COMMUNITY)
Admission: RE | Disposition: A | Discharge status: Home / Self Care | Payer: Self-pay | Source: Ambulatory Visit | Attending: Cardiovascular Disease

## 2012-05-31 DIAGNOSIS — I200 Unstable angina: Secondary | ICD-10-CM | POA: Insufficient documentation

## 2012-05-31 DIAGNOSIS — G43909 Migraine, unspecified, not intractable, without status migrainosus: Secondary | ICD-10-CM | POA: Diagnosis present

## 2012-05-31 DIAGNOSIS — J45909 Unspecified asthma, uncomplicated: Secondary | ICD-10-CM | POA: Insufficient documentation

## 2012-05-31 DIAGNOSIS — I2510 Atherosclerotic heart disease of native coronary artery without angina pectoris: Secondary | ICD-10-CM | POA: Diagnosis present

## 2012-05-31 DIAGNOSIS — E785 Hyperlipidemia, unspecified: Secondary | ICD-10-CM | POA: Insufficient documentation

## 2012-05-31 DIAGNOSIS — Y831 Surgical operation with implant of artificial internal device as the cause of abnormal reaction of the patient, or of later complication, without mention of misadventure at the time of the procedure: Secondary | ICD-10-CM | POA: Insufficient documentation

## 2012-05-31 DIAGNOSIS — Z7901 Long term (current) use of anticoagulants: Secondary | ICD-10-CM | POA: Insufficient documentation

## 2012-05-31 DIAGNOSIS — Z951 Presence of aortocoronary bypass graft: Secondary | ICD-10-CM | POA: Insufficient documentation

## 2012-05-31 DIAGNOSIS — K219 Gastro-esophageal reflux disease without esophagitis: Secondary | ICD-10-CM | POA: Diagnosis present

## 2012-05-31 DIAGNOSIS — M109 Gout, unspecified: Secondary | ICD-10-CM | POA: Diagnosis present

## 2012-05-31 DIAGNOSIS — E669 Obesity, unspecified: Secondary | ICD-10-CM | POA: Insufficient documentation

## 2012-05-31 DIAGNOSIS — Z79899 Other long term (current) drug therapy: Secondary | ICD-10-CM | POA: Insufficient documentation

## 2012-05-31 DIAGNOSIS — T82897A Other specified complication of cardiac prosthetic devices, implants and grafts, initial encounter: Secondary | ICD-10-CM | POA: Insufficient documentation

## 2012-05-31 HISTORY — DX: Pneumonia, unspecified organism: J18.9

## 2012-05-31 HISTORY — DX: Angina pectoris, unspecified: I20.9

## 2012-05-31 HISTORY — DX: Gout, unspecified: M10.9

## 2012-05-31 HISTORY — DX: Essential (primary) hypertension: I10

## 2012-05-31 HISTORY — DX: Acute myocardial infarction, unspecified: I21.9

## 2012-05-31 HISTORY — DX: Personal history of other diseases of the digestive system: Z87.19

## 2012-05-31 HISTORY — DX: Shortness of breath: R06.02

## 2012-05-31 HISTORY — PX: LEFT HEART CATHETERIZATION WITH CORONARY/GRAFT ANGIOGRAM: SHX5450

## 2012-05-31 HISTORY — DX: Hyperlipidemia, unspecified: E78.5

## 2012-05-31 HISTORY — DX: Anxiety disorder, unspecified: F41.9

## 2012-05-31 HISTORY — DX: Unspecified asthma, uncomplicated: J45.909

## 2012-05-31 HISTORY — DX: Sleep apnea, unspecified: G47.30

## 2012-05-31 HISTORY — DX: Chronic obstructive pulmonary disease, unspecified: J44.9

## 2012-05-31 HISTORY — DX: Major depressive disorder, single episode, unspecified: F32.9

## 2012-05-31 HISTORY — PX: CORONARY ANGIOPLASTY WITH STENT PLACEMENT: SHX49

## 2012-05-31 HISTORY — DX: Migraine, unspecified, not intractable, without status migrainosus: G43.909

## 2012-05-31 HISTORY — DX: Unspecified hemorrhoids: K64.9

## 2012-05-31 HISTORY — DX: Unspecified osteoarthritis, unspecified site: M19.90

## 2012-05-31 HISTORY — DX: Gastro-esophageal reflux disease without esophagitis: K21.9

## 2012-05-31 HISTORY — DX: Anemia, unspecified: D64.9

## 2012-05-31 HISTORY — DX: Nutritional anemia, unspecified: D53.9

## 2012-05-31 HISTORY — DX: Depression, unspecified: F32.A

## 2012-05-31 HISTORY — DX: Cerebral infarction, unspecified: I63.9

## 2012-05-31 LAB — POCT ACTIVATED CLOTTING TIME: Activated Clotting Time: 443 seconds

## 2012-05-31 SURGERY — LEFT HEART CATHETERIZATION WITH CORONARY/GRAFT ANGIOGRAM
Anesthesia: LOCAL

## 2012-05-31 MED ORDER — MIDAZOLAM HCL 2 MG/2ML IJ SOLN
INTRAMUSCULAR | Status: AC
  Filled 2012-05-31: qty 2

## 2012-05-31 MED ORDER — BUTALBITAL-APAP-CAFFEINE 50-325-40 MG PO TABS: 1.0000 | ORAL_TABLET | ORAL | Status: DC | PRN

## 2012-05-31 MED ORDER — CLOPIDOGREL BISULFATE 75 MG PO TABS: 75.0000 mg | ORAL_TABLET | Freq: Every morning | ORAL | Status: DC

## 2012-05-31 MED ORDER — SODIUM CHLORIDE 0.9 % IV SOLN
0.2500 mg/kg/h | INTRAVENOUS | Status: AC
  Filled 2012-05-31: qty 250

## 2012-05-31 MED ORDER — DIPHENHYDRAMINE HCL 25 MG PO CAPS: 25.0000 mg | ORAL_CAPSULE | ORAL | Status: DC | PRN

## 2012-05-31 MED ORDER — NITROGLYCERIN 0.2 MG/ML ON CALL CATH LAB
INTRAVENOUS | Status: AC
  Filled 2012-05-31: qty 1

## 2012-05-31 MED ORDER — PROMETHAZINE HCL 25 MG RE SUPP: 25.0000 mg | RECTAL | Status: DC | PRN

## 2012-05-31 MED ORDER — METOPROLOL SUCCINATE ER 100 MG PO TB24
100.0000 mg | ORAL_TABLET | Freq: Every morning | ORAL | Status: DC
  Filled 2012-05-31: qty 1

## 2012-05-31 MED ORDER — DIAZEPAM 5 MG PO TABS
5.0000 mg | ORAL_TABLET | ORAL | Status: AC
  Administered 2012-05-31: 5 mg via ORAL
  Filled 2012-05-31: qty 1

## 2012-05-31 MED ORDER — SERTRALINE HCL 100 MG PO TABS
100.0000 mg | ORAL_TABLET | Freq: Every day | ORAL | Status: DC
  Administered 2012-05-31: 12:00:00 100 mg via ORAL
  Filled 2012-05-31 (×2): qty 1

## 2012-05-31 MED ORDER — ATORVASTATIN CALCIUM 80 MG PO TABS
80.0000 mg | ORAL_TABLET | Freq: Every day | ORAL | Status: DC
  Administered 2012-06-01: 80 mg via ORAL
  Filled 2012-05-31 (×2): qty 1

## 2012-05-31 MED ORDER — NITROGLYCERIN 0.4 MG SL SUBL
0.4000 mg | SUBLINGUAL_TABLET | SUBLINGUAL | Status: DC | PRN
  Administered 2012-05-31 – 2012-06-01 (×4): 0.4 mg via SUBLINGUAL
  Filled 2012-05-31: qty 25

## 2012-05-31 MED ORDER — MORPHINE SULFATE 2 MG/ML IJ SOLN
2.0000 mg | INTRAMUSCULAR | Status: DC | PRN
  Administered 2012-06-01: 2 mg via INTRAVENOUS
  Filled 2012-05-31: qty 1

## 2012-05-31 MED ORDER — CLOPIDOGREL BISULFATE 300 MG PO TABS
ORAL_TABLET | ORAL | Status: AC
  Filled 2012-05-31: qty 1

## 2012-05-31 MED ORDER — LIDOCAINE HCL (PF) 1 % IJ SOLN
INTRAMUSCULAR | Status: AC
  Filled 2012-05-31: qty 30

## 2012-05-31 MED ORDER — ACETAMINOPHEN 325 MG PO TABS: 650.0000 mg | ORAL_TABLET | ORAL | Status: DC | PRN

## 2012-05-31 MED ORDER — MEPERIDINE HCL 50 MG PO TABS: 50.0000 mg | ORAL_TABLET | Freq: Two times a day (BID) | ORAL | Status: DC

## 2012-05-31 MED ORDER — BIVALIRUDIN 250 MG IV SOLR
INTRAVENOUS | Status: AC
  Filled 2012-05-31: qty 250

## 2012-05-31 MED ORDER — AMITRIPTYLINE HCL 50 MG PO TABS
50.0000 mg | ORAL_TABLET | Freq: Every morning | ORAL | Status: DC
  Filled 2012-05-31: qty 1

## 2012-05-31 MED ORDER — ADENOSINE 12 MG/4ML IV SOLN
16.0000 mL | Freq: Once | INTRAVENOUS | Status: DC
  Filled 2012-05-31: qty 16

## 2012-05-31 MED ORDER — ONDANSETRON 8 MG PO TBDP
8.0000 mg | ORAL_TABLET | Freq: Three times a day (TID) | ORAL | Status: DC | PRN
  Filled 2012-05-31: qty 1

## 2012-05-31 MED ORDER — SODIUM CHLORIDE 0.9 % IV SOLN
0.2500 mg/kg/h | INTRAVENOUS | Status: DC
  Filled 2012-05-31: qty 250

## 2012-05-31 MED ORDER — MOMETASONE FURO-FORMOTEROL FUM 100-5 MCG/ACT IN AERO
2.0000 | INHALATION_SPRAY | Freq: Two times a day (BID) | RESPIRATORY_TRACT | Status: DC
  Administered 2012-05-31: 2 via RESPIRATORY_TRACT
  Filled 2012-05-31: qty 8.8

## 2012-05-31 MED ORDER — ASPIRIN EC 81 MG PO TBEC
81.0000 mg | DELAYED_RELEASE_TABLET | Freq: Every day | ORAL | Status: DC
  Filled 2012-05-31 (×2): qty 1

## 2012-05-31 MED ORDER — SODIUM CHLORIDE 0.9 % IJ SOLN: 3.0000 mL | INTRAMUSCULAR | Status: DC | PRN

## 2012-05-31 MED ORDER — IPRATROPIUM BROMIDE HFA 17 MCG/ACT IN AERS
3.0000 | INHALATION_SPRAY | RESPIRATORY_TRACT | Status: DC
  Administered 2012-05-31: 21:00:00 3 via RESPIRATORY_TRACT
  Filled 2012-05-31: qty 12.9

## 2012-05-31 MED ORDER — ONDANSETRON HCL 4 MG/2ML IJ SOLN
4.0000 mg | Freq: Four times a day (QID) | INTRAMUSCULAR | Status: DC | PRN
  Administered 2012-05-31: 4 mg via INTRAVENOUS
  Filled 2012-05-31: qty 2

## 2012-05-31 MED ORDER — LOPERAMIDE HCL 2 MG PO CAPS: 2.0000 mg | ORAL_CAPSULE | ORAL | Status: DC | PRN

## 2012-05-31 MED ORDER — IBUPROFEN 800 MG PO TABS
800.0000 mg | ORAL_TABLET | Freq: Three times a day (TID) | ORAL | Status: DC | PRN
  Filled 2012-05-31: qty 1

## 2012-05-31 MED ORDER — PROMETHAZINE HCL 25 MG PO TABS: 25.0000 mg | ORAL_TABLET | ORAL | Status: DC | PRN

## 2012-05-31 MED ORDER — ASPIRIN 81 MG PO CHEW: 81.0000 mg | CHEWABLE_TABLET | Freq: Every day | ORAL | Status: DC

## 2012-05-31 MED ORDER — POTASSIUM CHLORIDE CRYS ER 20 MEQ PO TBCR
40.0000 meq | EXTENDED_RELEASE_TABLET | Freq: Two times a day (BID) | ORAL | Status: DC
  Administered 2012-06-01: 01:00:00 40 meq via ORAL
  Filled 2012-05-31 (×3): qty 2

## 2012-05-31 MED ORDER — OMEGA-3-ACID ETHYL ESTERS 1 G PO CAPS
2.0000 g | ORAL_CAPSULE | Freq: Two times a day (BID) | ORAL | Status: DC
Start: 2012-06-01 — End: 2012-06-01
  Filled 2012-05-31 (×2): qty 2

## 2012-05-31 MED ORDER — PANTOPRAZOLE SODIUM 40 MG PO TBEC
40.0000 mg | DELAYED_RELEASE_TABLET | Freq: Every day | ORAL | Status: DC
  Administered 2012-06-01: 40 mg via ORAL
  Filled 2012-05-31: qty 1

## 2012-05-31 MED ORDER — SODIUM CHLORIDE 0.9 % IV SOLN
INTRAVENOUS | Status: DC
  Administered 2012-05-31: 15:00:00 via INTRAVENOUS

## 2012-05-31 MED ORDER — HEPARIN (PORCINE) IN NACL 2-0.9 UNIT/ML-% IJ SOLN
INTRAMUSCULAR | Status: AC
  Filled 2012-05-31: qty 1000

## 2012-05-31 MED ORDER — MONTELUKAST SODIUM 10 MG PO TABS
10.0000 mg | ORAL_TABLET | Freq: Every day | ORAL | Status: DC
  Administered 2012-06-01: 10 mg via ORAL
  Filled 2012-05-31 (×2): qty 1

## 2012-05-31 MED ORDER — SODIUM CHLORIDE 0.9 % IV SOLN
INTRAVENOUS | Status: AC
  Administered 2012-05-31: 19:00:00 via INTRAVENOUS

## 2012-05-31 MED ORDER — PREDNISONE 20 MG PO TABS
20.0000 mg | ORAL_TABLET | Freq: Every day | ORAL | Status: DC | PRN
  Filled 2012-05-31: qty 1

## 2012-05-31 MED ORDER — NITROGLYCERIN 0.4 MG/HR TD PT24
0.4000 mg | MEDICATED_PATCH | Freq: Every day | TRANSDERMAL | Status: DC
  Filled 2012-05-31 (×2): qty 1

## 2012-05-31 MED ORDER — TORSEMIDE 20 MG PO TABS
40.0000 mg | ORAL_TABLET | Freq: Every morning | ORAL | Status: DC
  Filled 2012-05-31: qty 2

## 2012-05-31 MED ORDER — ALPRAZOLAM 0.25 MG PO TABS
0.5000 mg | ORAL_TABLET | ORAL | Status: DC | PRN
  Administered 2012-05-31: 20:00:00 0.5 mg via ORAL
  Filled 2012-05-31: qty 2

## 2012-05-31 MED ORDER — DIPHENHYDRAMINE HCL (SLEEP) 25 MG PO TABS: 25.0000 mg | ORAL_TABLET | ORAL | Status: DC | PRN

## 2012-05-31 MED ORDER — TRAMADOL HCL 50 MG PO TABS
50.0000 mg | ORAL_TABLET | ORAL | Status: DC | PRN
  Administered 2012-06-01: 50 mg via ORAL
  Filled 2012-05-31: qty 1

## 2012-05-31 MED ORDER — ALBUTEROL SULFATE HFA 108 (90 BASE) MCG/ACT IN AERS
2.0000 | INHALATION_SPRAY | RESPIRATORY_TRACT | Status: DC | PRN
  Filled 2012-05-31: qty 6.7

## 2012-05-31 MED ORDER — CLOPIDOGREL BISULFATE 75 MG PO TABS
75.0000 mg | ORAL_TABLET | Freq: Every day | ORAL | Status: DC
  Administered 2012-06-01: 08:00:00 75 mg via ORAL
  Filled 2012-05-31: qty 1

## 2012-05-31 MED ORDER — IPRATROPIUM BROMIDE HFA 17 MCG/ACT IN AERS
3.0000 | INHALATION_SPRAY | RESPIRATORY_TRACT | Status: DC
  Filled 2012-05-31: qty 12.9

## 2012-05-31 NOTE — H&P (Signed)
Patient ID: Rita Morgan MRN: [id number], DOB/AGE: April 07, 1956   Admit date: 05/31/2012   Primary Physician: Rita Morgan., MD Primary Cardiologist: Rita Rita Morgan  HPI:  See Rita Morgan office note from Nov 2013. Rita Morgan is a 56 y/o female well known to our group with CAD and multiple other medical problems. She had CABG X 2 in 1998 with a RIMA-Dx and SVG-RCA. She has had multiple caths. She had a DES to the SVG-RCA in 2007. Her last cath was April 2011. She had a low risk Myoview 5 months ago. She saw Rita Rita Morgan and has been complaining of chest pain. She says she recently underwent an extensive GI evaluation at [facility] but feels the pain she has been having is cardiac. She describes mid sternal pressure, relieved with NTG. Yesterday she had a Migraine, followed by nausea, followed by SSCP for which she took multiple NTG with eventual relief. She is admitted now for diagnostic cath. She is currently pain free.   Problem List: Past Medical History  Diagnosis Date  . Gout   . Dyslipidemia   . Sleep apnea   . Migraines   . Asthma     No past surgical history on file.   Allergies:  Allergies  Allergen Reactions  . Diltiazem Anaphylaxis  . Other Anaphylaxis    -quinolones  . Augmentin (Amoxicillin-Pot Clavulanate) Other (See Comments)    welps  wheezing     Home Medications Prescriptions prior to admission  Medication Sig Dispense Refill  . albuterol (PROVENTIL HFA;VENTOLIN HFA) 108 (90 BASE) MCG/ACT inhaler Inhale 2 puffs into the lungs every 4 (four) hours as needed. For shortness of breath      . ALPRAZolam (XANAX) 0.5 MG tablet Take 0.5 mg by mouth every 4 (four) hours as needed. For anxiety      . amitriptyline (ELAVIL) 50 MG tablet Take 50 mg by mouth every morning.      Marland Kitchen aspirin EC 81 MG tablet Take 81 mg by mouth daily.      Marland Kitchen atorvastatin (LIPITOR) 80 MG tablet Take 80 mg by mouth at bedtime.      . butalbital-aspirin-caffeine (FIORINAL) 50-325-40 MG per capsule Take 1  capsule by mouth every 4 (four) hours as needed. For pain      . clopidogrel (PLAVIX) 75 MG tablet Take 75 mg by mouth every morning.      . colchicine 0.6 MG tablet Take 0.6-1.2 mg by mouth daily as needed. For flare  Takes 2 tablets at onset then 1 tablet one hour later      . diphenhydrAMINE (SOMINEX) 25 MG tablet Take 25 mg by mouth every 4 (four) hours as needed. For itching/allergies      . fluticasone-salmeterol (ADVAIR HFA) 115-21 MCG/ACT inhaler Inhale 2 puffs into the lungs every 12 (twelve) hours.      Marland Kitchen ibuprofen (ADVIL,MOTRIN) 800 MG tablet Take 800 mg by mouth every 8 (eight) hours as needed. For pain      . ipratropium (ATROVENT HFA) 17 MCG/ACT inhaler Inhale 3 puffs into the lungs every 4 (four) hours. While awake.      . ipratropium-albuterol (DUONEB) 0.5-2.5 (3) MG/3ML SOLN Take 3 mLs by nebulization every 4 (four) hours as needed. For shortness of breath      . lansoprazole (PREVACID) 30 MG capsule Take 30 mg by mouth every 12 (twelve) hours.      Marland Kitchen loperamide (IMODIUM) 2 MG capsule Take 2 mg by mouth as needed. After each loose stool      .  meperidine (DEMEROL) 50 MG tablet Take 50 mg by mouth every 12 (twelve) hours.      . metoprolol succinate (TOPROL-XL) 100 MG 24 hr tablet Take 100 mg by mouth every morning. Take with or immediately following a meal.      . montelukast (SINGULAIR) 10 MG tablet Take 10 mg by mouth at bedtime.      . Multiple Vitamin (MULTIVITAMIN WITH MINERALS) TABS Take 1 tablet by mouth every morning.      . nitroGLYCERIN (NITRODUR - DOSED IN MG/24 HR) 0.4 mg/hr Place 1 patch onto the skin daily. Place in qam  Off at bedtime      . nitroGLYCERIN (NITROSTAT) 0.4 MG SL tablet Place 0.4 mg under the tongue every 5 (five) minutes as needed. For chest pain      . omega-3 acid ethyl esters (LOVAZA) 1 G capsule Take 2 g by mouth 2 times daily at 12 noon and 4 pm.      . ondansetron (ZOFRAN-ODT) 8 MG disintegrating tablet Take 8 mg by mouth every 8 (eight) hours  as needed. For nausea      . OVER THE COUNTER MEDICATION Take 1 tablet by mouth every 12 (twelve) hours. Calcium with vitamin d      . potassium chloride SA (K-DUR,KLOR-CON) 20 MEQ tablet Take 40 mEq by mouth every 12 (twelve) hours.      . predniSONE (DELTASONE) 10 MG tablet Take 20 mg by mouth daily as needed. When takes colchicine      . promethazine (PHENERGAN) 25 MG suppository Place 25 mg rectally every 4 (four) hours as needed. For nausea      . promethazine (PHENERGAN) 25 MG tablet Take 25 mg by mouth every 4 (four) hours as needed. For nausea      . sertraline (ZOLOFT) 100 MG tablet Take 100 mg by mouth at bedtime.      . torsemide (DEMADEX) 20 MG tablet Take 40 mg by mouth every morning.      . traMADol (ULTRAM) 50 MG tablet Take 50 mg by mouth every 4 (four) hours as needed. For pain         No family history on file.   History   Social History  . Marital Status: Single    Spouse Name: N/A    Number of Children: N/A  . Years of Education: N/A   Occupational History  . Not on file.   Social History Main Topics  . Smoking status: Former Games developer  . Smokeless tobacco: Not on file  . Alcohol Use: Not on file  . Drug Use: Not on file  . Sexually Active: Not on file   Other Topics Concern  . Not on file   Social History Narrative  . No narrative on file     Review of Systems: General: negative for chills, fever, night sweats or weight changes.  Cardiovascular: negative for chest pain, dyspnea on exertion, edema, orthopnea, palpitations, paroxysmal nocturnal dyspnea or shortness of breath Dermatological: negative for rash Respiratory: negative for cough or wheezing Urologic: negative for hematuria Abdominal: negative for nausea, vomiting, diarrhea, bright red blood per rectum, melena, or hematemesis Neurologic: negative for visual changes, syncope, or dizziness All other systems reviewed and are otherwise negative except as noted above.  Physical Exam: Blood  pressure 127/71, pulse 68, temperature 97.6 F (36.4 C), resp. rate 16, height 5\' 5"  (1.651 m), weight 88.451 kg (195 lb), last menstrual period 05/01/2012, SpO2 96.00%.  General appearance: alert, cooperative, no  distress and moderately obese Neck: no carotid bruit and no JVD Lungs: clear to auscultation bilaterally Heart: regular rate and rhythm Abdomen: obese Extremities: extremities normal, atraumatic, no cyanosis or edema Pulses: 2+ and symmetric Skin: Skin color, texture, turgor normal. No rashes or lesions Neurologic: Grossly normal    Labs:  No results found for this or any previous visit (from the past 24 hour(s)).   Radiology/Studies: No results found.  EKG:NSR without acute changes  ASSESSMENT AND PLAN:   Principal Problem:  *Unstable angina  Active Problems:  CAD (coronary artery disease), CABG X 2 (RIMA-DX, SVG-RCA) 1998, SVG-RCA DES 2007  Dyslipidemia  Obesity  Asthma  Gout  GERD (gastroesophageal reflux disease), just had "extensive" GI work up at [facility]  Migraine history  Plan- Cath today  Deland Pretty, PA-C 05/31/2012, 2:54 PM

## 2012-05-31 NOTE — Op Note (Signed)
Rita Morgan is a 56 y.o. female    [id number] LOCATION:  FACILITY: [facility]  PHYSICIAN: Nanetta Batty, M.D. 1955-08-25   DATE OF PROCEDURE:  05/31/2012  DATE OF DISCHARGE:  [facility]  CARDIAC CATHETERIZATION     History obtained from chart review. Rita Morgan is a 57 year old moderately overweight divorced Caucasian female with a history of CAD status post coronary artery bypass grafting in 1998 with multiple interventions since. She had a vein graft to the PDA and a free RIMA to the diagonal branch. I stented her RCA vein graft with a Cypher  Drug-eluting stent August of 2007. She's had a circumflex marginal stent as well as a proximal LAD stent. Her other problems include hypertension and hyperlipidemia, obstructive sleep apnea on CPAP and good. She's had progressive nitrate responsive chest pain with a negative Myoview 5 months ago. Based on this she presents today for outpatient diagnostic coronary arteriography to define anatomy and rule out an  ischemic etiology.   PROCEDURE DESCRIPTION:    The patient was brought to the second floor  [facility] cath lab in the postabsorptive state. She waspremedicated with Valium 5 mg by mouth, and IV Versed and fentanyl.. Her right groinwas prepped and shaved in usual sterile fashion. Xylocaine 1% was used  for local anesthesia. A 5 French sheath was inserted into the right common femoral artery using standard Seldinger technique. 5 French left Judkins diagnostic catheters multiple pigtail catheter was used for selective coronary angiography, selective graft angiography and left ventriculography. Visipaque dye was used for the entirety of the case. Retrograde aorta, left ventricular end pressures were recorded.   HEMODYNAMICS:    AO SYSTOLIC/AO DIASTOLIC: 111/67   LV SYSTOLIC/LV DIASTOLIC: 116/14  ANGIOGRAPHIC RESULTS:   1. Left main; normal  2. LAD; 70% in-stent restenosis and throat within the  proximal LAD stent. The first diagonal branch was occluded 3. Left circumflex; is a patent stent in the circumflex obtuse marginal branch proximal segment..  4. Right coronary artery; right coronary artery was dominant and was essentially occluded. 5. SVG TO PDA was widely patent with 30% in-stent restenosis" within the stent placed in the mid shaft of the vein graft. 6. The free RIMA to the diagonal branch was widely patent  7. Left ventriculography; RAO left ventriculogram was performed using  25 mL of Visipaque dye at 12 mL/second. The overall LVEF estimated  60 %  Without wall motion abnormalities  IMPRESSION:Rita Morgan had patent grafts with moderate in-stent restenosis within the proximal LAD stent. We'll proceed with FFR determination of physiologic significance significance.  Procedure description: Existing 5 French sheath in the right common the femoral artery was exchanged over a wire for a 6 [place] sheath. The patient was already an aspirin products additional 300 mg of by mouth Plavix. She received Angiomax bolus with an ACT of 443. Total contrast utilized and the case was 185 cc. Using a 6 [place] XB LAD 3.5 guide catheter along with an FFR wire FFR was performed with intravenous administration of adenosine. Is measured at 0.83 suggesting that the lesion was not physiologically significant. Because  of her worsening symptoms I elected to proceed with intravascular ultrasound interrogation using a 1/190 cm length Asahi ProWater wire, and an IVIS catheter catheter intravascular ultrasound was performed. The distal reference segment measured 3.15 mm x 2.77 mm. The lesion measured 1.59 x 1.78 mm correlating to 2.1 MM squared suggesting that this was a significant lesion. Based on this I elected  to proceed with percutaneous and auscultation. Angioscu;pt balloon was used  and it was "watermelon seeding". I then exchanged for a 2.5 x 12 mm long [place] splinter at 16 atmospheres creating a  channel..following this I be stented using a 3.0 x 15 mm long expedition drug-eluting stent deployed at 16 atmospheres. I postdilated with a 3.5 mm x 12 mm long [place] splinter at 16 atmospheres (3.26 mm) resulting in reduction and a 70% in-stent restenosis" to 0% residual with excellent flow. The patient tolerated the procedure well. The guidewire and catheter were removed and the sheath was inserted in place. He'll need to continue Angiomax at reduced dose for 4 hours and then discontinue it. The sheath will be removed several hours thereafter and pressure will be held on the groin to achieve hemostasis.The patient will be  hydrated overnight, treated with aspirin and Plavix, and discharged home in the morning.  Runell Gess MD, [facility] 05/31/2012 6:03 PM

## 2012-05-31 NOTE — H&P (Signed)
  H & P will be scanned in.  Pt was reexamined and existing H & P reviewed. No changes found.  Runell Gess, MD [facility] 05/31/2012 4:19 PM

## 2012-06-01 MED ORDER — ACETAMINOPHEN 325 MG PO TABS: 650.0000 mg | ORAL_TABLET | ORAL | Status: DC | PRN

## 2012-06-01 MED FILL — Dextrose Inj 5%: INTRAVENOUS | Qty: 50 | Status: AC

## 2012-06-01 NOTE — Progress Notes (Signed)
Assessed for utilization review 

## 2012-06-01 NOTE — Progress Notes (Signed)
Pt refused to have labs drawn this morning 06/01/12, Importance of having labs drawn discussed with pt.

## 2012-06-01 NOTE — Discharge Summary (Signed)
Patient ID: Rita Morgan,  MRN: [id number], DOB/AGE: Mar 09, 1956 56 y.o.  Admit date: 05/31/2012 Discharge date: 06/01/2012  Primary Care Provider:  Primary Cardiologist: Dr Allyson Sabal  Discharge Diagnoses Principal Problem:  *Unstable angina, ISR to prior LAD stent, Rx'd overlapping DES 05/31/12 Active Problems:  CAD (coronary artery disease), CABG X 2 (RIMA-DX, SVG-RCA) 1998, SVG-RCA DES 2007  Dyslipidemia  Obesity  Asthma  Gout  GERD (gastroesophageal reflux disease), just had "extensive" GI work up at [facility]  Migraine history    Procedures: Cath/PCI 05/31/12   Hospital Course:   Rita Morgan is a 56 year old moderately overweight divorced Caucasian female with a history of CAD status post coronary artery bypass grafting in 1998 with multiple interventions since. She had a vein graft to the PDA and a free RIMA to the diagonal branch. I stented her RCA vein graft with a Cypher Drug-eluting stent August of 2007. She's had a circumflex marginal stent as well as a proximal LAD stent. Her other problems include hypertension and hyperlipidemia, obstructive sleep apnea on CPAP. She's had progressive nitrate responsive chest pain with a negative Myoview 5 months ago. Based on this she presented 05/31/12 for outpatient diagnostic coronary arteriography to define anatomy and rule out an ischemic etiology. Cath revealed ISR to the LAD stent. This was Rx'd with overlapping DES. She tolerated the procedure well. The next morning she received news that her younger sister had died suddenly and the pt asked to be discharged immediately. She refused labs. She will follow up with Dr Allyson Sabal as an OP. Her Rt groin site is without hematoma or ecchymosis.  Discharge Vitals:  Blood pressure 95/64, pulse 70, temperature 97.9 F (36.6 C), temperature source Oral, resp. rate 17, height 5\' 5"  (1.651 m), weight 88.1 kg (194 lb 3.6 oz), last menstrual period 05/01/2012, SpO2 99.00%.    Labs: Results for orders  placed during the hospital encounter of 05/31/12 (from the past 48 hour(s))  POCT ACTIVATED CLOTTING TIME     Status: Normal   Collection Time   05/31/12  4:49 PM      Component Value Range Comment   Activated Clotting Time 443       Disposition:      Follow-up Information    Follow up with Runell Gess, MD. (office will call)    Contact information:   [address] [place] [address] [phone]          Discharge Medications:    Medication List     As of 06/01/2012  7:53 AM    TAKE these medications         acetaminophen 325 MG tablet   Commonly known as: TYLENOL   Take 2 tablets (650 mg total) by mouth every 4 (four) hours as needed.      albuterol 108 (90 BASE) MCG/ACT inhaler   Commonly known as: PROVENTIL HFA;VENTOLIN HFA   Inhale 2 puffs into the lungs every 4 (four) hours as needed. For shortness of breath      ALPRAZolam 0.5 MG tablet   Commonly known as: XANAX   Take 0.5 mg by mouth every 4 (four) hours as needed. For anxiety      amitriptyline 50 MG tablet   Commonly known as: ELAVIL   Take 50 mg by mouth every morning.      aspirin EC 81 MG tablet   Take 81 mg by mouth daily.      atorvastatin 80 MG tablet   Commonly known as: LIPITOR  Take 80 mg by mouth at bedtime.      butalbital-aspirin-caffeine 50-325-40 MG per capsule   Commonly known as: FIORINAL   Take 1 capsule by mouth every 4 (four) hours as needed. For pain      clopidogrel 75 MG tablet   Commonly known as: PLAVIX   Take 75 mg by mouth every morning.      colchicine 0.6 MG tablet   Take 0.6-1.2 mg by mouth daily as needed. For flare    Takes 2 tablets at onset then 1 tablet one hour later      diphenhydrAMINE 25 MG tablet   Commonly known as: SOMINEX   Take 25 mg by mouth every 4 (four) hours as needed. For itching/allergies      fluticasone-salmeterol 115-21 MCG/ACT inhaler   Commonly known as: ADVAIR HFA   Inhale 2 puffs into the lungs every 12  (twelve) hours.      ibuprofen 800 MG tablet   Commonly known as: ADVIL,MOTRIN   Take 800 mg by mouth every 8 (eight) hours as needed. For pain      ipratropium 17 MCG/ACT inhaler   Commonly known as: ATROVENT HFA   Inhale 3 puffs into the lungs every 4 (four) hours. While awake.      ipratropium-albuterol 0.5-2.5 (3) MG/3ML Soln   Commonly known as: DUONEB   Take 3 mLs by nebulization every 4 (four) hours as needed. For shortness of breath      lansoprazole 30 MG capsule   Commonly known as: PREVACID   Take 30 mg by mouth every 12 (twelve) hours.      loperamide 2 MG capsule   Commonly known as: IMODIUM   Take 2 mg by mouth as needed. After each loose stool      meperidine 50 MG tablet   Commonly known as: DEMEROL   Take 50 mg by mouth every 12 (twelve) hours.      metoprolol succinate 100 MG 24 hr tablet   Commonly known as: TOPROL-XL   Take 100 mg by mouth every morning. Take with or immediately following a meal.      montelukast 10 MG tablet   Commonly known as: SINGULAIR   Take 10 mg by mouth at bedtime.      multivitamin with minerals Tabs   Take 1 tablet by mouth every morning.      nitroGLYCERIN 0.4 MG SL tablet   Commonly known as: NITROSTAT   Place 0.4 mg under the tongue every 5 (five) minutes as needed. For chest pain      nitroGLYCERIN 0.4 mg/hr   Commonly known as: NITRODUR - Dosed in mg/24 hr   Place 1 patch onto the skin daily. Place in qam   Off at bedtime      omega-3 acid ethyl esters 1 G capsule   Commonly known as: LOVAZA   Take 2 g by mouth 2 times daily at 12 noon and 4 pm.      ondansetron 8 MG disintegrating tablet   Commonly known as: ZOFRAN-ODT   Take 8 mg by mouth every 8 (eight) hours as needed. For nausea      OVER THE COUNTER MEDICATION   Take 1 tablet by mouth every 12 (twelve) hours. Calcium with vitamin d      potassium chloride SA 20 MEQ tablet   Commonly known as: K-DUR,KLOR-CON   Take 40 mEq by mouth every 12 (twelve)  hours.      predniSONE 10 MG  tablet   Commonly known as: DELTASONE   Take 20 mg by mouth daily as needed. When takes colchicine      promethazine 25 MG tablet   Commonly known as: PHENERGAN   Take 25 mg by mouth every 4 (four) hours as needed. For nausea      promethazine 25 MG suppository   Commonly known as: PHENERGAN   Place 25 mg rectally every 4 (four) hours as needed. For nausea      sertraline 100 MG tablet   Commonly known as: ZOLOFT   Take 100 mg by mouth at bedtime.      torsemide 20 MG tablet   Commonly known as: DEMADEX   Take 40 mg by mouth every morning.      traMADol 50 MG tablet   Commonly known as: ULTRAM   Take 50 mg by mouth every 4 (four) hours as needed. For pain            Duration of Discharge Encounter: Greater than 30 minutes including physician time.  Jolene Provost PA-C 06/01/2012 7:53 AM

## 2012-06-01 NOTE — Progress Notes (Signed)
Site area: right groin  Site Prior to Removal:  Level 0  Pressure Applied For 20 MINUTES    Minutes Beginning at 12:00  Manual:   yes  Patient Status During Pull:  AxOx3  Post Pull Groin Site:  Level 0  Post Pull Instructions Given:  yes  Post Pull Pulses Present:  yes  Dressing Applied:  yes  Comments:

## 2012-06-01 NOTE — Progress Notes (Signed)
See PA note.  Right groin site level 0, pt. Denies pain, ambulated in hall w/o difficulty; seen briefly by cardiac rehab.  DC'd home with instructions.

## 2012-06-01 NOTE — Progress Notes (Signed)
[id number] Pt is getting dressed as we talked. Pt receptive to ed but overwhelmed by news of sister's death. Handed pt ex ed and diet sheets. Pt has attended CRP 2 before and not interested in attending again. Pt does not use tobacco. Knows importance of taking plavix. Pt states she knows how to take NTG if needed and is on patches. Condolences given.Rita Lubke DunlapRN

## 2012-08-08 ENCOUNTER — Encounter: Payer: Self-pay | Admitting: Pharmacist Clinician (PhC)/ Clinical Pharmacy Specialist

## 2013-02-19 ENCOUNTER — Telehealth: Payer: Self-pay | Admitting: Cardiology

## 2013-02-19 NOTE — Telephone Encounter (Signed)
Pt called complaining of SOB yesterday. She did not want to come to the ER but will agree to be seen in the office in the morning. 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  Corine Shelter PA-C 02/19/2013 8:33 AM

## 2013-02-21 ENCOUNTER — Other Ambulatory Visit: Payer: Self-pay

## 2013-02-21 ENCOUNTER — Encounter: Payer: Self-pay | Admitting: Cardiology

## 2013-02-21 ENCOUNTER — Ambulatory Visit (INDEPENDENT_AMBULATORY_CARE_PROVIDER_SITE_OTHER): Payer: 59 | Admitting: Cardiology

## 2013-02-21 VITALS — BP 124/80 | HR 69 | Ht 66.0 in | Wt 201.8 lb

## 2013-02-21 DIAGNOSIS — R079 Chest pain, unspecified: Secondary | ICD-10-CM

## 2013-02-21 DIAGNOSIS — I200 Unstable angina: Secondary | ICD-10-CM

## 2013-02-21 DIAGNOSIS — Z95820 Peripheral vascular angioplasty status with implants and grafts: Secondary | ICD-10-CM

## 2013-02-21 DIAGNOSIS — E876 Hypokalemia: Secondary | ICD-10-CM

## 2013-02-21 DIAGNOSIS — Z9889 Other specified postprocedural states: Secondary | ICD-10-CM

## 2013-02-21 DIAGNOSIS — I2510 Atherosclerotic heart disease of native coronary artery without angina pectoris: Secondary | ICD-10-CM

## 2013-02-21 LAB — BASIC METABOLIC PANEL
Co2: 30 mmol/L (ref 21–32)
Creatinine: 0.77 mg/dL (ref 0.60–1.30)
EGFR (African American): >60
Glucose: 152 mg/dL — ABNORMAL HIGH (ref 65–99)
Osmolality: 277 (ref 275–301)
Potassium: 3.6 mmol/L (ref 3.5–5.1)

## 2013-02-21 NOTE — Progress Notes (Signed)
02/21/2013   PCP: Danella Penton., MD   Chief Complaint  Patient presents with  . OV    chest pain    Primary Cardiologist: Dr. Allyson Sabal  HPI:  57 year old white female with history of coronary disease including remote bypass grafting in 1998 with multiple interventions since. Had a negative Myoview July of last year but then developed chest pain in December of 2013 undergoing cardiac catheterization revealing a patent free RIMA to the diagonal, a patent vein to the distal right with a patent stent, patent mid AV groove circumflex stent and 70% in-stent restenosis within the proximal LAD stent. Dr. Allyson Sabal restented her with an Xpedition drug-eluting stent and her symptoms resolved. She has episodic angina due to stress but she usually is able to control this. Her primary care physician follows her lipids.  She presents today after having increasing episodes of chest pain similar to her symptoms prior to the stent placed in December. The chest pain is also associated with nausea and shortness of breath. This weekend it was severe enough that she fell back across the bed. She feels this weekend may have been associated with hypokalemia. She had inadvertently not taken her potassium for a week. She is back on the medication currently but is not sure of the level would be appropriate.  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  Other history includes hypertension hyperlipidemia obstructive sleep apnea on CPAP history of GERD and migraine headaches.  Allergies  Allergen Reactions  . Augmentin [Amoxicillin-Pot Clavulanate] Other (See Comments)    welps  wheezing  . Diltiazem Anaphylaxis  . Fentanyl And Related Other (See Comments)    "altered mental status" (05/31/2012)  . Quinolones Anaphylaxis  . Amlodipine     Current Outpatient Prescriptions  Medication Sig Dispense Refill  . acetaminophen (TYLENOL) 325 MG tablet Take 2 tablets (650 mg total) by mouth  every 4 (four) hours as needed.      Marland Kitchen albuterol (PROVENTIL HFA;VENTOLIN HFA) 108 (90 BASE) MCG/ACT inhaler Inhale 2 puffs into the lungs every 4 (four) hours as needed. For shortness of breath      . ALPRAZolam (XANAX) 0.5 MG tablet Take 0.5 mg by mouth every 4 (four) hours as needed. For anxiety      . aspirin EC 81 MG tablet Take 81 mg by mouth daily.      Marland Kitchen atorvastatin (LIPITOR) 80 MG tablet Take 80 mg by mouth at bedtime.      . butalbital-aspirin-caffeine (FIORINAL) 50-325-40 MG per capsule Take 1 capsule by mouth every 4 (four) hours as needed. For pain      . clopidogrel (PLAVIX) 75 MG tablet Take 75 mg by mouth every morning.      . colchicine 0.6 MG tablet Take 0.6-1.2 mg by mouth daily as needed. For flare  Takes 2 tablets at onset then 1 tablet one hour later      . fluticasone-salmeterol (ADVAIR HFA) 115-21 MCG/ACT inhaler Inhale 2 puffs into the lungs every 12 (twelve) hours.      Marland Kitchen ibuprofen (ADVIL,MOTRIN) 800 MG tablet Take 800 mg by mouth every 8 (eight) hours as needed. For pain      . ipratropium (ATROVENT HFA) 17 MCG/ACT inhaler Inhale 3 puffs into the lungs every 4 (four) hours. While awake.      . ipratropium-albuterol (DUONEB) 0.5-2.5 (3) MG/3ML SOLN Take 3 mLs by nebulization every 4 (four) hours as needed. For shortness of breath      .  lansoprazole (PREVACID) 30 MG capsule Take 30 mg by mouth every 12 (twelve) hours.      . meperidine (DEMEROL) 50 MG tablet Take 50 mg by mouth every 12 (twelve) hours.      . metoprolol succinate (TOPROL-XL) 100 MG 24 hr tablet Take 100 mg by mouth every morning. Take with or immediately following a meal.      . montelukast (SINGULAIR) 10 MG tablet Take 10 mg by mouth at bedtime.      . Multiple Vitamin (MULTIVITAMIN WITH MINERALS) TABS Take 1 tablet by mouth every morning.      . nitroGLYCERIN (NITRODUR - DOSED IN MG/24 HR) 0.4 mg/hr Place 1 patch onto the skin daily. Place in qam  Off at bedtime      . nitroGLYCERIN (NITROSTAT) 0.4 MG  SL tablet Place 0.4 mg under the tongue every 5 (five) minutes as needed. For chest pain      . omega-3 acid ethyl esters (LOVAZA) 1 G capsule Take 2 g by mouth 2 times daily at 12 noon and 4 pm.      . ondansetron (ZOFRAN-ODT) 8 MG disintegrating tablet Take 8 mg by mouth every 8 (eight) hours as needed. For nausea      . OVER THE COUNTER MEDICATION Take 1 tablet by mouth every 12 (twelve) hours. Calcium with vitamin d      . potassium chloride SA (K-DUR,KLOR-CON) 20 MEQ tablet Take 20 mEq by mouth every 12 (twelve) hours.       . promethazine (PHENERGAN) 25 MG tablet Take 25 mg by mouth every 4 (four) hours as needed. For nausea      . sertraline (ZOLOFT) 100 MG tablet Take 100 mg by mouth at bedtime.      . torsemide (DEMADEX) 20 MG tablet Take 40 mg by mouth every morning.      Marland Kitchen amitriptyline (ELAVIL) 50 MG tablet Take 50 mg by mouth every morning.       No current facility-administered medications for this visit.    Past Medical History  Diagnosis Date  . Gout     "dx'd within the last 12 months" (05/31/2012)  . Dyslipidemia   . Asthma   . H/O hiatal hernia     "sliding; never corrected" (05/31/2012)  . Hypertension   . Anginal pain   . Myocardial infarction 1980's    "2 in my 30's" (05/31/2012)  . Pneumonia     "couple times; last time 1998 w/CABG" (05/31/2012)  . Shortness of breath   . Sleep apnea     "tried BiPAP; couldn't tolerate it" (05/31/2012)  . COPD (chronic obstructive pulmonary disease)     "some people say yes; some no" (05/31/2012)  . Anemia     "from vaginal bleeding; worse in my early 40''s" (05/31/2012)  . Macrocytic anemia   . GERD (gastroesophageal reflux disease)   . Bleeding hemorrhoids   . Migraines     "multiple times a week" (05/31/2012)  . Stroke     "I think so; 2 dr's say I did; 3 dr said I didn't; took about 6 months to get qthing back to functioning; fine now" (05/31/2012)  . Arthritis     "fingers" (05/31/2012)  . Anxiety   . Depression    . CAD (coronary artery disease) 10/23/2008    echo- EF >55%; LV systolic fcn normal; trace mitral regurgitation; essentially normal TTE  . CAD (coronary artery disease) 12/14/2011    L/Myoview - EF 72%; perfusion normal in  all regions; EKG negative for ischemia; s/p stent to LAD for ISR 05/2012  . OSA on CPAP     Past Surgical History  Procedure Laterality Date  . Coronary angioplasty with stent placement  2007    RCA vein graft w/ Cypher DES; previous circumflex marginal stent and proximal LAD stent  . Ercp      "several" (05/31/2012)  . Upper gi endoscopy      "4-5" (05/31/2012)  . Tonsillectomy and adenoidectomy  1980's  . Cholecystectomy  ~ 2010  . Knee arthroscopy w/ meniscectomy  ~ 2008    "w/bakers cyst; probably on my left" (05/31/2012)  . Dilation and curettage of uterus  1976-2012    "4 D&C's" (05/31/2012)  . Coronary artery bypass graft  1998    CABG X2; vein graft to PDA nad fee LIMA to a diagonal branch  . Septoplasty  1990's    "nose had been broken multiple times" (05/31/2012)  . Cardiac catheterization  05/31/2012    LAD 70% ISR; RCA dominant and essentially occluded; stented LAD w/ 3.0 x 15mm Xpedition DES deployed at 16 atmospheres    ZOX:WRUEAVW:UJ colds or fevers, no weight changes Skin:no rashes or ulcers HEENT:no blurred vision, no congestion CV:see HPI PUL:see HPI GI:no diarrhea constipation or melena, + indigestion GU:no hematuria, no dysuria MS:no joint pain, no claudication Neuro:no syncope, no lightheadedness, freq migraines Endo:no diabetes, no thyroid disease  Cholesterol recently 171 triglycerides 252 LDL 80 HDL 39 Her hemoglobin A1c was 6.2  PHYSICAL EXAM BP 124/80  Pulse 69  Ht 5\' 6"  (1.676 m)  Wt 201 lb 12.8 oz (91.536 kg)  BMI 32.59 kg/m2 General:Pleasant affect, NAD Skin:Warm and dry, brisk capillary refill HEENT:normocephalic, sclera clear, mucus membranes moist Neck:supple, no JVD, no bruits  Heart:S1S2 RRR without murmur,  gallup, rub or click Lungs:clear without rales, rhonchi, or wheezes WJX:BJYNW, soft, non tender, + BS, do not palpate liver spleen or masses Ext:no lower ext edema, 2+ pedal pulses, 2+ radial pulses Neuro:alert and oriented, MAE, follows commands, + facial symmetry  EKG: Sinus rhythm without acute changes from previous tracing  ASSESSMENT AND PLAN Unstable angina, ISR to prior LAD stent, Rx'd overlapping DES 05/31/12 Now with similar symptoms though not quite as severe.  Having dyspnea on exertion, nausea, chest pain.  Significant episode of chest pain Sunday she is concerned it may have also be related to hypokalemia.  Discuss with Dr. Allyson Sabal  planned for [facility] with recent new stents placed in December 2013 now similar symptoms. Patient is unable to tolerate walking on a treadmill with history of migraines, stroke.  If the Myoview is positive we will call her and set her up for cardiac catheterization and if negative she'll follow with Dr. Allyson Sabal for further evaluation.  CAD (coronary artery disease), CABG X 2 (RIMA-DX, SVG-RCA) 1998, SVG-RCA DES 2007 See above  Hypokalemia She missed a week of taking her potassium this was done inadvertently.  She is currently taking but we will check a basic metabolic panel to ensure that her level is back up.

## 2013-02-21 NOTE — Assessment & Plan Note (Signed)
She missed a week of taking her potassium this was done inadvertently.  She is currently taking but we will check a basic metabolic panel to ensure that her level is back up.

## 2013-02-21 NOTE — Patient Instructions (Addendum)
We will schedule lexiscan myoview for this week first of next.  Follow up with Dr. Allyson Sabal for results.  If positive we will schedule for a cardiac cath.  Have lab work done.    Call or go to ER for increasing symptoms.

## 2013-02-21 NOTE — Assessment & Plan Note (Signed)
Now with similar symptoms though not quite as severe.  Having dyspnea on exertion, nausea, chest pain.  Significant episode of chest pain Sunday she is concerned it may have also be related to hypokalemia.  Discuss with Dr. Allyson Sabal  planned for [facility] with recent new stents placed in December 2013 now similar symptoms. Patient is unable to tolerate walking on a treadmill with history of migraines, stroke.  If the Myoview is positive we will call her and set her up for cardiac catheterization and if negative she'll follow with Dr. Allyson Sabal for further evaluation.

## 2013-02-21 NOTE — Assessment & Plan Note (Signed)
See above

## 2013-02-22 ENCOUNTER — Telehealth (HOSPITAL_COMMUNITY): Payer: Self-pay | Admitting: Cardiovascular Disease

## 2013-02-22 NOTE — Telephone Encounter (Signed)
When scheduling this patient for her stress test she inquired about her blood work results . Please call.

## 2013-02-23 ENCOUNTER — Ambulatory Visit (HOSPITAL_COMMUNITY)
Admission: RE | Admit: 2013-02-23 | Discharge: 2013-02-23 | Disposition: A | Discharge status: Home / Self Care | Payer: 59 | Source: Ambulatory Visit | Attending: Cardiovascular Disease | Admitting: Cardiovascular Disease

## 2013-02-23 DIAGNOSIS — R42 Dizziness and giddiness: Secondary | ICD-10-CM | POA: Insufficient documentation

## 2013-02-23 DIAGNOSIS — R0989 Other specified symptoms and signs involving the circulatory and respiratory systems: Secondary | ICD-10-CM | POA: Insufficient documentation

## 2013-02-23 DIAGNOSIS — I2510 Atherosclerotic heart disease of native coronary artery without angina pectoris: Secondary | ICD-10-CM

## 2013-02-23 DIAGNOSIS — Z95820 Peripheral vascular angioplasty status with implants and grafts: Secondary | ICD-10-CM

## 2013-02-23 DIAGNOSIS — R079 Chest pain, unspecified: Secondary | ICD-10-CM | POA: Insufficient documentation

## 2013-02-23 DIAGNOSIS — R5381 Other malaise: Secondary | ICD-10-CM | POA: Insufficient documentation

## 2013-02-23 DIAGNOSIS — E669 Obesity, unspecified: Secondary | ICD-10-CM | POA: Insufficient documentation

## 2013-02-23 DIAGNOSIS — R0609 Other forms of dyspnea: Secondary | ICD-10-CM | POA: Insufficient documentation

## 2013-02-23 DIAGNOSIS — Z87891 Personal history of nicotine dependence: Secondary | ICD-10-CM | POA: Insufficient documentation

## 2013-02-23 DIAGNOSIS — Z8673 Personal history of transient ischemic attack (TIA), and cerebral infarction without residual deficits: Secondary | ICD-10-CM | POA: Insufficient documentation

## 2013-02-23 DIAGNOSIS — Z8249 Family history of ischemic heart disease and other diseases of the circulatory system: Secondary | ICD-10-CM | POA: Insufficient documentation

## 2013-02-23 DIAGNOSIS — R55 Syncope and collapse: Secondary | ICD-10-CM | POA: Insufficient documentation

## 2013-02-23 DIAGNOSIS — I10 Essential (primary) hypertension: Secondary | ICD-10-CM | POA: Insufficient documentation

## 2013-02-23 MED ORDER — AMINOPHYLLINE 25 MG/ML IV SOLN
75.0000 mg | Freq: Once | INTRAVENOUS | Status: AC
  Administered 2013-02-23: 75 mg via INTRAVENOUS

## 2013-02-23 MED ORDER — TECHNETIUM TC 99M SESTAMIBI GENERIC - CARDIOLITE
10.6000 | Freq: Once | INTRAVENOUS | Status: AC | PRN
  Administered 2013-02-23: 11 via INTRAVENOUS

## 2013-02-23 MED ORDER — TECHNETIUM TC 99M SESTAMIBI GENERIC - CARDIOLITE
31.0000 | Freq: Once | INTRAVENOUS | Status: AC | PRN
  Administered 2013-02-23: 31 via INTRAVENOUS

## 2013-02-23 MED ORDER — REGADENOSON 0.4 MG/5ML IV SOLN
0.4000 mg | Freq: Once | INTRAVENOUS | Status: AC
  Administered 2013-02-23: 0.4 mg via INTRAVENOUS

## 2013-02-23 NOTE — Procedures (Addendum)
[facility] [address] [place] [address] [phone]  Cardiology Nuclear Med Study  Rita Morgan is a 57 y.o. female     MRN : [id number]     DOB: 1956-04-10  Procedure Date: 02/23/2013  Nuclear Med Background Indication for Stress Test:  Evaluation for Ischemia and Graft Patency History:  Asthma, COPD and CAD;MI;CABG X2--1998;STENT/PTCA--05/31/2012 Cardiac Risk Factors: Family History - CAD, History of Smoking, Hypertension, Lipids, Obesity and TIA  Symptoms:  Chest Pain, Dizziness, DOE, Fatigue, Light-Headedness, Near Syncope, Palpitations and SOB   Nuclear Pre-Procedure Caffeine/Decaff Intake:  7:00pm NPO After: 5:00am   IV Site: R Hand  IV 0.9% NS with Angio Cath:  22g  Chest Size (in):  N/A IV Started by: Emmit Pomfret, RN  Height: 5\' 6"  (1.676 m)  Cup Size: C  BMI:  Body mass index is 32.46 kg/(m^2). Weight:  201 lb (91.173 kg)   Tech Comments:  N/A    Nuclear Med Study 1 or 2 day study: 1 day  Stress Test Type:  Lexiscan  Order Authorizing Provider:  Obie Dredge   Resting Radionuclide: Technetium 67m Sestamibi  Resting Radionuclide Dose: 10.6 mCi   Stress Radionuclide:  Technetium 65m Sestamibi  Stress Radionuclide Dose: 31.0 mCi           Stress Protocol Rest HR: 77 Stress HR: 88  Rest BP:117/92 Stress BP: 140/69  Exercise Time (min): n/a METS: n/a          Dose of Adenosine (mg):  n/a Dose of Lexiscan: 0.4 mg  Dose of Atropine (mg): n/a Dose of Dobutamine: n/a mcg/kg/min (at max HR)  Stress Test Technologist: Ernestene Mention, CCT Nuclear Technologist: Gonzella Lex, CNMT   Rest Procedure:  Myocardial perfusion imaging was performed at rest 45 minutes following the intravenous administration of Technetium 19m Sestamibi. Stress Procedure:  The patient received IV Lexiscan 0.4 mg over 15-seconds.  Technetium 5m Sestamibi injected at 30-seconds.  Due to patient's shortness of breath and chest  pressure, she was given IV Aminophylline 75 mg. Symptoms were resolved during recovery. There were no significant changes with Lexiscan.  Quantitative spect images were obtained after a 45 minute delay.  Transient Ischemic Dilatation (Normal <1.22):  1.01 Lung/Heart Ratio (Normal <0.45):  0.31 QGS EDV:  64 ml QGS ESV:  16 ml LV Ejection Fraction: 75%  Signed by       Rest ECG: NSR - Normal EKG  Stress ECG: No significant change from baseline ECG  QPS Raw Data Images:  Normal; no motion artifact; normal heart/lung ratio. Stress Images:  Normal homogeneous uptake in all areas of the myocardium. Rest Images:  Normal homogeneous uptake in all areas of the myocardium. Subtraction (SDS):  No evidence of ischemia.  Impression Exercise Capacity:  Lexiscan with no exercise. BP Response:  Normal blood pressure response. Clinical Symptoms:  Significant chest pain. ECG Impression:  No significant ST segment change suggestive of ischemia. Comparison with Prior Nuclear Study: No significant change from previous study  Overall Impression:  Normal stress nuclear study.  LV Wall Motion:  NL LV Function; NL Wall Motion   Runell Gess, MD  02/23/2013 4:39 PM

## 2013-02-26 ENCOUNTER — Telehealth: Payer: Self-pay | Admitting: Cardiovascular Disease

## 2013-02-26 NOTE — Telephone Encounter (Signed)
Pt called regarding her lab work and that she will like a phone call only and only if it is to give her, her results. She doesn't understand why we can not see the lab work for [facility].

## 2013-02-26 NOTE — Telephone Encounter (Signed)
Pt is calling regarding her lab work that was done at [facility]. She is not sure why you could not find the results and she stated that she was leaving and you can leave a message on her machine.

## 2013-02-26 NOTE — Telephone Encounter (Signed)
Lm for patient to call back with lab questions.  No new labs are currently in the system.

## 2013-02-26 NOTE — Telephone Encounter (Signed)
We cannot see [facility] labs.  I left message with our fax number

## 2013-02-26 NOTE — Telephone Encounter (Signed)
Message forwarded to K. Vogel, RN.  

## 2013-02-26 NOTE — Telephone Encounter (Signed)
Pt called back and said [facility] lab had sent her lab to [place] on 9-3 at 7 P.M.,9-4 at 11:01 A.M. And today-9-8.

## 2013-02-27 ENCOUNTER — Encounter: Payer: Self-pay | Admitting: Cardiovascular Disease

## 2013-03-13 ENCOUNTER — Encounter: Payer: Self-pay | Admitting: *Deleted

## 2013-03-13 NOTE — Telephone Encounter (Signed)
Lm that blood work looks good.

## 2013-04-02 ENCOUNTER — Ambulatory Visit: Payer: PRIVATE HEALTH INSURANCE | Admitting: Cardiovascular Disease

## 2013-04-26 ENCOUNTER — Other Ambulatory Visit: Payer: Self-pay

## 2013-05-25 ENCOUNTER — Encounter: Payer: Self-pay | Admitting: Cardiovascular Disease

## 2013-05-25 ENCOUNTER — Ambulatory Visit (INDEPENDENT_AMBULATORY_CARE_PROVIDER_SITE_OTHER): Payer: 59 | Admitting: Cardiovascular Disease

## 2013-05-25 VITALS — BP 118/82 | HR 66 | Ht 65.5 in | Wt 205.4 lb

## 2013-05-25 DIAGNOSIS — I10 Essential (primary) hypertension: Secondary | ICD-10-CM

## 2013-05-25 DIAGNOSIS — I2510 Atherosclerotic heart disease of native coronary artery without angina pectoris: Secondary | ICD-10-CM

## 2013-05-25 DIAGNOSIS — E785 Hyperlipidemia, unspecified: Secondary | ICD-10-CM

## 2013-05-25 NOTE — Assessment & Plan Note (Signed)
Status post coronary artery bypass grafting in 1998. She's had stenting of her proximal LAD, proximal RCA, AV groove circumflex and the body of her vein graft to her PDA. She was left 05/31/12 because of chest pain and had put in-stent restenosis within her LAD stent which was restented. Because of recurrent chest pain back in September she underwent Myoview stress testing which was entirely normal. She's had minimal chest pain since which is her normal .

## 2013-05-25 NOTE — Patient Instructions (Signed)
Your physician wants you to follow-up in: 6 months with Laura Ingold NP and 1 year with Dr Berry.  You will receive a reminder letter in the mail two months in advance. If you don't receive a letter, please call our office to schedule the follow-up appointment.  

## 2013-05-25 NOTE — Assessment & Plan Note (Signed)
Well-controlled on current medications 

## 2013-05-25 NOTE — Assessment & Plan Note (Signed)
On statin therapy. Her labs are followed in [facility]. She will call us copies of the results

## 2013-05-25 NOTE — Progress Notes (Signed)
05/25/2013 Rita Morgan   [birth year]  [id number]  Primary Physician Danella Penton., MD Primary Cardiologist: Runell Gess MD [name]   HPI:  57 year old white female with history of coronary disease including remote bypass grafting in 1998 with multiple interventions since. Had a negative Myoview July of last year but then developed chest pain in December of 2013 undergoing cardiac catheterization revealing a patent free RIMA to the diagonal, a patent vein to the distal right with a patent stent, patent mid AV groove circumflex stent and 70% in-stent restenosis within the proximal LAD stent. Dr. Allyson Sabal restented her with an Xpedition drug-eluting stent and her symptoms resolved. She has episodic angina due to stress but she usually is able to control this. Her primary care physician follows her lipids.  She presents today after having increasing episodes of chest pain similar to her symptoms prior to the stent placed in December. The chest pain is also associated with nausea and shortness of breath. This weekend it was severe enough that she fell back across the bed. She feels this weekend may have been associated with hypokalemia. She had inadvertently not taken her potassium for a week. She is back on the medication currently but is not sure of the level would be appropriate.  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 Other history includes hypertension hyperlipidemia obstructive sleep apnea on CPAP history of GERD and migraine headaches.  She was seen by Nada Boozer registered nurse practitioner 02/21/13 with accelerated angina. I also saw her at that time. A subsequent Myoview stress test was normal. The frequency of her chest discomfort has since declined to its normal level.    Current Outpatient Prescriptions  Medication Sig Dispense Refill  . acetaminophen (TYLENOL) 325 MG tablet Take 2 tablets (650 mg total) by mouth every 4 (four) hours as  needed.      Marland Kitchen albuterol (PROVENTIL HFA;VENTOLIN HFA) 108 (90 BASE) MCG/ACT inhaler Inhale 2 puffs into the lungs every 4 (four) hours as needed. For shortness of breath      . ALPRAZolam (XANAX) 0.5 MG tablet Take 0.5 mg by mouth every 4 (four) hours as needed. For anxiety      . aspirin EC 81 MG tablet Take 81 mg by mouth daily.      Marland Kitchen atorvastatin (LIPITOR) 80 MG tablet Take 80 mg by mouth at bedtime.      . butalbital-aspirin-caffeine (FIORINAL) 50-325-40 MG per capsule Take 1 capsule by mouth every 4 (four) hours as needed. For pain      . calcium carbonate (TUMS - DOSED IN MG ELEMENTAL CALCIUM) 500 MG chewable tablet Chew 1 tablet by mouth as needed for indigestion or heartburn.      . clopidogrel (PLAVIX) 75 MG tablet Take 75 mg by mouth every morning.      . colchicine 0.6 MG tablet Take 0.6-1.2 mg by mouth daily as needed. For flare  Takes 2 tablets at onset then 1 tablet one hour later      . diphenhydrAMINE (BENADRYL) 25 MG tablet Take 25 mg by mouth every 4 (four) hours as needed.      . fluticasone-salmeterol (ADVAIR HFA) 115-21 MCG/ACT inhaler Inhale 2 puffs into the lungs every 12 (twelve) hours.      Marland Kitchen ibuprofen (ADVIL,MOTRIN) 800 MG tablet Take 800 mg by mouth every 8 (eight) hours as needed. For pain      . ipratropium (ATROVENT HFA) 17 MCG/ACT inhaler Inhale 3  puffs into the lungs every 4 (four) hours. While awake.      . ipratropium-albuterol (DUONEB) 0.5-2.5 (3) MG/3ML SOLN Take 3 mLs by nebulization every 4 (four) hours as needed. For shortness of breath      . lansoprazole (PREVACID) 30 MG capsule Take 30 mg by mouth every 12 (twelve) hours.      Marland Kitchen loperamide (IMODIUM) 2 MG capsule Take by mouth as needed for diarrhea or loose stools.      . meperidine (DEMEROL) 50 MG tablet Take 50 mg by mouth every 12 (twelve) hours.      . metoprolol succinate (TOPROL-XL) 100 MG 24 hr tablet Take 100 mg by mouth every morning. Take with or immediately following a meal.      . montelukast  (SINGULAIR) 10 MG tablet Take 10 mg by mouth at bedtime.      . Multiple Vitamin (MULTIVITAMIN WITH MINERALS) TABS Take 1 tablet by mouth every morning.      . nitroGLYCERIN (NITRODUR - DOSED IN MG/24 HR) 0.4 mg/hr Place 1 patch onto the skin daily. Place in qam  Off at bedtime      . nitroGLYCERIN (NITROSTAT) 0.4 MG SL tablet Place 0.4 mg under the tongue every 5 (five) minutes as needed. For chest pain      . omega-3 acid ethyl esters (LOVAZA) 1 G capsule Take 2 g by mouth 2 times daily at 12 noon and 4 pm.      . ondansetron (ZOFRAN-ODT) 8 MG disintegrating tablet Take 8 mg by mouth every 8 (eight) hours as needed. For nausea      . OVER THE COUNTER MEDICATION Take 1 tablet by mouth every 12 (twelve) hours. Calcium with vitamin d      . potassium chloride SA (K-DUR,KLOR-CON) 20 MEQ tablet Take 40 mEq by mouth every 12 (twelve) hours.       . promethazine (PHENERGAN) 25 MG tablet Take 25 mg by mouth every 4 (four) hours as needed. For nausea      . sertraline (ZOLOFT) 100 MG tablet Take 100 mg by mouth at bedtime.      . torsemide (DEMADEX) 20 MG tablet Take 40 mg by mouth every morning.       No current facility-administered medications for this visit.    Allergies  Allergen Reactions  . Augmentin [Amoxicillin-Pot Clavulanate] Other (See Comments)    welps  wheezing  . Diltiazem Anaphylaxis  . Fentanyl And Related Other (See Comments)    "altered mental status" (05/31/2012)  . Quinolones Anaphylaxis  . Amlodipine     History   Social History  . Marital Status: Single    Spouse Name: N/A    Number of Children: N/A  . Years of Education: N/A   Occupational History  . Not on file.   Social History Main Topics  . Smoking status: Former Smoker -- 1.00 packs/day for 20 years    Types: Cigarettes  . Smokeless tobacco: Never Used     Comment: 05/31/2012 "stopped smoking ~ 1990's"  . Alcohol Use: No  . Drug Use: No  . Sexual Activity: Not Currently   Other Topics Concern  .  Not on file   Social History Narrative  . No narrative on file     Review of Systems: General: negative for chills, fever, night sweats or weight changes.  Cardiovascular: negative for chest pain, dyspnea on exertion, edema, orthopnea, palpitations, paroxysmal nocturnal dyspnea or shortness of breath Dermatological: negative for rash Respiratory: negative  for cough or wheezing Urologic: negative for hematuria Abdominal: negative for nausea, vomiting, diarrhea, bright red blood per rectum, melena, or hematemesis Neurologic: negative for visual changes, syncope, or dizziness All other systems reviewed and are otherwise negative except as noted above.    Blood pressure 118/82, pulse 66, height 5' 5.5" (1.664 m), weight 205 lb 6.4 oz (93.169 kg).  General appearance: alert and no distress Neck: no adenopathy, no carotid bruit, no JVD, supple, symmetrical, trachea midline and thyroid not enlarged, symmetric, no tenderness/mass/nodules Lungs: clear to auscultation bilaterally Heart: regular rate and rhythm, S1, S2 normal, no murmur, click, rub or gallop Extremities: extremities normal, atraumatic, no cyanosis or edema  EKG normal sinus rhythm at 66 without ST or T wave changes  ASSESSMENT AND PLAN:   CAD (coronary artery disease), CABG X 2 (RIMA-DX, SVG-RCA) 1998, SVG-RCA DES 2007 Status post coronary artery bypass grafting in 1998. She's had stenting of her proximal LAD, proximal RCA, AV groove circumflex and the body of her vein graft to her PDA. She was left 05/31/12 because of chest pain and had put in-stent restenosis within her LAD stent which was restented. Because of recurrent chest pain back in September she underwent Myoview stress testing which was entirely normal. She's had minimal chest pain since which is her normal .  Dyslipidemia On statin therapy. Her labs are followed in [facility]. She will call us copies of the results  Essential hypertension Well-controlled  on current medications      Runell Gess MD [facility], [facility] 05/25/2013 8:52 AM

## 2013-05-28 ENCOUNTER — Encounter: Payer: Self-pay | Admitting: Cardiovascular Disease

## 2013-06-27 ENCOUNTER — Ambulatory Visit: Payer: Self-pay

## 2013-07-02 ENCOUNTER — Ambulatory Visit: Payer: Self-pay

## 2013-08-03 ENCOUNTER — Observation Stay: Payer: Self-pay | Admitting: Internal Medicine

## 2013-08-03 DIAGNOSIS — I200 Unstable angina: Secondary | ICD-10-CM

## 2013-08-03 LAB — COMPREHENSIVE METABOLIC PANEL
Albumin: 3.5 g/dL (ref 3.4–5.0)
Alkaline Phosphatase: 112 U/L
Anion Gap: 4 — ABNORMAL LOW (ref 7–16)
BUN: 4 mg/dL — ABNORMAL LOW (ref 7–18)
Bilirubin,Total: 0.4 mg/dL (ref 0.2–1.0)
CO2: 33 mmol/L — AB (ref 21–32)
CREATININE: 0.91 mg/dL (ref 0.60–1.30)
Calcium, Total: 9.2 mg/dL (ref 8.5–10.1)
Chloride: 99 mmol/L (ref 98–107)
GLUCOSE: 144 mg/dL — AB (ref 65–99)
OSMOLALITY: 271 (ref 275–301)
Potassium: 2.7 mmol/L — ABNORMAL LOW (ref 3.5–5.1)
SGOT(AST): 45 U/L — ABNORMAL HIGH (ref 15–37)
SGPT (ALT): 56 U/L (ref 12–78)
Sodium: 136 mmol/L (ref 136–145)
Total Protein: 6.7 g/dL (ref 6.4–8.2)

## 2013-08-03 LAB — MAGNESIUM: Magnesium: 1.3 mg/dL — ABNORMAL LOW

## 2013-08-03 LAB — CBC
HCT: 40.5 % (ref 35.0–47.0)
HGB: 13.9 g/dL (ref 12.0–16.0)
MCH: 32.4 pg (ref 26.0–34.0)
MCHC: 34.2 g/dL (ref 32.0–36.0)
MCV: 95 fL (ref 80–100)
Platelet: 212 10*3/uL (ref 150–440)
RBC: 4.28 10*6/uL (ref 3.80–5.20)
RDW: 12.8 % (ref 11.5–14.5)
WBC: 7.9 10*3/uL (ref 3.6–11.0)

## 2013-08-03 LAB — TROPONIN I: Troponin-I: <0.02 ng/mL

## 2013-08-03 LAB — CK TOTAL AND CKMB (NOT AT ARMC)
CK, Total: 51 U/L
CK-MB: 0.9 ng/mL (ref 0.5–3.6)

## 2013-08-04 DIAGNOSIS — J45909 Unspecified asthma, uncomplicated: Secondary | ICD-10-CM

## 2013-08-04 LAB — LIPID PANEL
Cholesterol: 178 mg/dL
HDL Cholesterol: 27 mg/dL — ABNORMAL LOW
Ldl Cholesterol, Calc: 83 mg/dL
Triglycerides: 338 mg/dL — ABNORMAL HIGH
VLDL Cholesterol, Calc: 68 mg/dL — ABNORMAL HIGH

## 2013-08-04 LAB — CBC WITH DIFFERENTIAL/PLATELET
Basophil #: 0.1 x10 3/mm 3
Basophil %: 1.1 %
Eosinophil #: 0.2 x10 3/mm 3
Eosinophil %: 3.9 %
HCT: 36 %
HGB: 12.3 g/dL
Lymphocyte %: 28.5 %
Lymphs Abs: 1.7 x10 3/mm 3
MCH: 32.8 pg
MCHC: 34.2 g/dL
MCV: 96 fL
Monocyte #: 0.6 "x10 3/mm "
Monocyte %: 10.5 %
Neutrophil #: 3.3 x10 3/mm 3
Neutrophil %: 56 %
Platelet: 175 x10 3/mm 3
RBC: 3.76 X10 6/mm 3 — ABNORMAL LOW
RDW: 12.9 %
WBC: 6 x10 3/mm 3

## 2013-08-04 LAB — TROPONIN I

## 2013-08-04 LAB — BASIC METABOLIC PANEL WITH GFR
Anion Gap: 5 — ABNORMAL LOW
BUN: 9 mg/dL
Calcium, Total: 8.4 mg/dL — ABNORMAL LOW
Chloride: 101 mmol/L
Co2: 31 mmol/L
Creatinine: 0.95 mg/dL
EGFR (African American): >60
EGFR (Non-African Amer.): >60
Glucose: 149 mg/dL — ABNORMAL HIGH
Osmolality: 275
Potassium: 3.7 mmol/L
Sodium: 137 mmol/L

## 2013-08-04 LAB — MAGNESIUM: Magnesium: 2.4 mg/dL

## 2013-08-05 LAB — CBC WITH DIFFERENTIAL/PLATELET
Basophil #: 0.1 10*3/uL (ref 0.0–0.1)
Basophil %: 0.9 %
EOS ABS: 0.3 10*3/uL (ref 0.0–0.7)
EOS PCT: 4.8 %
HCT: 35.9 % (ref 35.0–47.0)
HGB: 12.5 g/dL (ref 12.0–16.0)
LYMPHS ABS: 1.5 10*3/uL (ref 1.0–3.6)
LYMPHS PCT: 24.4 %
MCH: 32.9 pg (ref 26.0–34.0)
MCHC: 34.7 g/dL (ref 32.0–36.0)
MCV: 95 fL (ref 80–100)
Monocyte #: 0.6 x10 3/mm (ref 0.2–0.9)
Monocyte %: 9.9 %
NEUTROS PCT: 60 %
Neutrophil #: 3.8 10*3/uL (ref 1.4–6.5)
Platelet: 165 10*3/uL (ref 150–440)
RBC: 3.79 10*6/uL — ABNORMAL LOW (ref 3.80–5.20)
RDW: 12.8 % (ref 11.5–14.5)
WBC: 6.3 10*3/uL (ref 3.6–11.0)

## 2013-08-05 LAB — BASIC METABOLIC PANEL
ANION GAP: 6 — AB (ref 7–16)
BUN: 9 mg/dL (ref 7–18)
Calcium, Total: 9 mg/dL (ref 8.5–10.1)
Chloride: 103 mmol/L (ref 98–107)
Co2: 30 mmol/L (ref 21–32)
Creatinine: 0.86 mg/dL (ref 0.60–1.30)
EGFR (African American): >60
Glucose: 169 mg/dL — ABNORMAL HIGH (ref 65–99)
Osmolality: 280 (ref 275–301)
Potassium: 3.8 mmol/L (ref 3.5–5.1)
Sodium: 139 mmol/L (ref 136–145)

## 2013-08-06 ENCOUNTER — Encounter: Payer: Self-pay | Admitting: Cardiovascular Disease

## 2013-08-06 DIAGNOSIS — I2510 Atherosclerotic heart disease of native coronary artery without angina pectoris: Secondary | ICD-10-CM

## 2013-08-06 DIAGNOSIS — E876 Hypokalemia: Secondary | ICD-10-CM

## 2013-08-06 LAB — CBC WITH DIFFERENTIAL/PLATELET
BASOS PCT: 0.9 %
Basophil #: 0.1 10*3/uL (ref 0.0–0.1)
Eosinophil #: 0.3 10*3/uL (ref 0.0–0.7)
Eosinophil %: 4.9 %
HCT: 34.5 % — ABNORMAL LOW (ref 35.0–47.0)
HGB: 12 g/dL (ref 12.0–16.0)
Lymphocyte #: 1.4 10*3/uL (ref 1.0–3.6)
Lymphocyte %: 22.1 %
MCH: 33.3 pg (ref 26.0–34.0)
MCHC: 34.9 g/dL (ref 32.0–36.0)
MCV: 95 fL (ref 80–100)
Monocyte #: 0.6 x10 3/mm (ref 0.2–0.9)
Monocyte %: 9.7 %
NEUTROS ABS: 4 10*3/uL (ref 1.4–6.5)
Neutrophil %: 62.4 %
PLATELETS: 165 10*3/uL (ref 150–440)
RBC: 3.62 10*6/uL — ABNORMAL LOW (ref 3.80–5.20)
RDW: 12.8 % (ref 11.5–14.5)
WBC: 6.3 10*3/uL (ref 3.6–11.0)

## 2013-08-06 LAB — BASIC METABOLIC PANEL
ANION GAP: 5 — AB (ref 7–16)
BUN: 8 mg/dL (ref 7–18)
CALCIUM: 8.3 mg/dL — AB (ref 8.5–10.1)
CREATININE: 0.89 mg/dL (ref 0.60–1.30)
Chloride: 101 mmol/L (ref 98–107)
Co2: 29 mmol/L (ref 21–32)
EGFR (Non-African Amer.): >60
Glucose: 144 mg/dL — ABNORMAL HIGH (ref 65–99)
OSMOLALITY: 271 (ref 275–301)
Potassium: 3.9 mmol/L (ref 3.5–5.1)
Sodium: 135 mmol/L — ABNORMAL LOW (ref 136–145)

## 2013-08-08 ENCOUNTER — Telehealth: Payer: Self-pay | Admitting: Cardiovascular Disease

## 2013-08-08 NOTE — Telephone Encounter (Signed)
Returned call and pt verified x 2.  Pt stated she was [facility] and told to f/u with cardiology and PCP and not to return to work.  No appts were scheduled.  Offered hosp f/u appt w/ Wilburt FinlayBryan Hager, PA-C tomorrow and pt scheduled appt at 1:40pm.  Pt advised to bring in discharge papers as there are no records available in the office from hospitalization.  Pt verbalized understanding and agreed w/ plan.  Message forwarded to Medical Records to try to get records from [facility] before pt's appt.

## 2013-08-08 NOTE — Telephone Encounter (Signed)
She went to ER and admitted last Friday. Had Cath on Monday,all this was at [facility]. Please call,need to discuss this and ask some questions,

## 2013-08-09 ENCOUNTER — Telehealth: Payer: Self-pay | Admitting: Physician Assistant

## 2013-08-09 ENCOUNTER — Encounter: Payer: Self-pay | Admitting: Physician Assistant

## 2013-08-09 ENCOUNTER — Ambulatory Visit (INDEPENDENT_AMBULATORY_CARE_PROVIDER_SITE_OTHER): Payer: 59 | Admitting: Physician Assistant

## 2013-08-09 VITALS — BP 108/66 | HR 78 | Ht 66.0 in | Wt 210.2 lb

## 2013-08-09 DIAGNOSIS — I2510 Atherosclerotic heart disease of native coronary artery without angina pectoris: Secondary | ICD-10-CM

## 2013-08-09 DIAGNOSIS — I10 Essential (primary) hypertension: Secondary | ICD-10-CM

## 2013-08-09 DIAGNOSIS — E669 Obesity, unspecified: Secondary | ICD-10-CM

## 2013-08-09 NOTE — Assessment & Plan Note (Signed)
Patient continues to complain of intermittent chest pain appears noncardiac in nature although she is convinced that it is her heart. She does not appear to be getting relief from nitroglycerin although sometimes she says it helps.  Her symptoms also occur with or without activity. She just had a left heart catheterization showing patent grafts and no cardiac cause for her chest pain. It was recommended that she followup with the GI doctor. Her blood pressure would not allow addition of Imdur were she's been on Axid in the past which she says interferes with her migraine medicine.  She says she continues to fare poorly and is requesting to be out of work until Monday.

## 2013-08-09 NOTE — Progress Notes (Signed)
Date:  08/09/2013   ID:  Rita Morgan, DOB [birth year], MRN [id number]  PCP:  Danella Penton., MD  Primary Cardiologist:  Allyson Sabal    History of Present Illness: Rita Morgan is a 58 y.o. female with history of coronary disease including remote bypass grafting in 1998 with multiple interventions since. Had a negative Myoview July of last year but then developed chest pain in December of 2013 undergoing cardiac catheterization revealing a patent free RIMA to the diagonal, a patent vein to the distal right with a patent stent, patent mid AV groove circumflex stent and 70% in-stent restenosis within the proximal LAD stent. Dr. Allyson Sabal restented her with an Xpedition drug-eluting stent and her symptoms resolved. She has episodic angina due to stress but she usually is able to control this. Her primary care physician follows her lipids.  She presents today after having increasing episodes of chest pain similar to her symptoms prior to the stent placed in December. The chest pain is also associated with nausea and shortness of breath. This weekend it was severe enough that she fell back across the bed. She feels this weekend may have been associated with hypokalemia. She had inadvertently not taken her potassium for a week. She is back on the medication currently but is not sure of the level would be appropriate.   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 Other history includes hypertension hyperlipidemia obstructive sleep apnea on CPAP history of GERD and migraine headaches.  A subsequent Myoview stress test was normal.  The patient was recently hospitalized at [facility] starting February 13 with complaints of angina and shortness of breath which stops her tracks. She ruled out for MRI. She's taken for left heart October 16.  This revealed significant three-vessel coronary disease with a patent free RIMA to the diagonal SVG to the RPDA And patent LAD and left circumflex  stents. Left ventriculography revealed an EF of 55%.  Patient presents today for followup she continues to complain of shortness of breath with minimal exertion, dizziness, intermittent chest pain for which she takes nitroglycerin sometimes and sometimes she doesn't. She does report some nausea and diaphoresis, however, she can be diaphoretic without chest pain. She also reports irregular heartbeats sometime.  She reports initially gaining weight.  She supposed to use his CPAP however she hasn't because she keeps breaking the device and having to spend money on repairs. She sleeps with 4 pillows at night.    The patient currently denies  vomiting, fever, dizziness, PND, cough, congestion, abdominal pain, hematochezia, melena, lower extremity edema, claudication.  Wt Readings from Last 3 Encounters:  08/09/13 210 lb 3.2 oz (95.346 kg)  05/25/13 205 lb 6.4 oz (93.169 kg)  02/23/13 201 lb (91.173 kg)     Past Medical History  Diagnosis Date  . Gout     "dx'd within the last 12 months" (05/31/2012)  . Dyslipidemia   . Asthma   . H/O hiatal hernia     "sliding; never corrected" (05/31/2012)  . Hypertension   . Anginal pain   . Myocardial infarction 1980's    "2 in my 30's" (05/31/2012)  . Pneumonia     "couple times; last time 1998 w/CABG" (05/31/2012)  . Shortness of breath   . Sleep apnea     "tried BiPAP; couldn't tolerate it" (05/31/2012)  . COPD (chronic obstructive pulmonary disease)     "some people say yes; some no" (05/31/2012)  . Anemia     "  from vaginal bleeding; worse in my early 40''s" (05/31/2012)  . Macrocytic anemia   . GERD (gastroesophageal reflux disease)   . Bleeding hemorrhoids   . Migraines     "multiple times a week" (05/31/2012)  . Stroke     "I think so; 2 dr's say I did; 3 dr said I didn't; took about 6 months to get qthing back to functioning; fine now" (05/31/2012)  . Arthritis     "fingers" (05/31/2012)  . Anxiety   . Depression   . CAD (coronary  artery disease) 10/23/2008    echo- EF >55%; LV systolic fcn normal; trace mitral regurgitation; essentially normal TTE  . CAD (coronary artery disease) 12/14/2011    L/Myoview - EF 72%; perfusion normal in all regions; EKG negative for ischemia; s/p stent to LAD for ISR 05/2012  . OSA on CPAP     Current Outpatient Prescriptions  Medication Sig Dispense Refill  . acetaminophen (TYLENOL) 325 MG tablet Take 2 tablets (650 mg total) by mouth every 4 (four) hours as needed.      Marland Kitchen albuterol (PROVENTIL HFA;VENTOLIN HFA) 108 (90 BASE) MCG/ACT inhaler Inhale 2 puffs into the lungs every 4 (four) hours as needed. For shortness of breath      . ALPRAZolam (XANAX) 0.5 MG tablet Take 0.5 mg by mouth every 4 (four) hours as needed. For anxiety      . aspirin EC 81 MG tablet Take 81 mg by mouth daily.      Marland Kitchen atorvastatin (LIPITOR) 80 MG tablet Take 80 mg by mouth at bedtime.      . butalbital-aspirin-caffeine (FIORINAL) 50-325-40 MG per capsule Take 1 capsule by mouth every 4 (four) hours as needed. For pain      . calcium carbonate (TUMS - DOSED IN MG ELEMENTAL CALCIUM) 500 MG chewable tablet Chew 1 tablet by mouth as needed for indigestion or heartburn.      . calcium citrate-vitamin D (CITRACAL+D) 315-200 MG-UNIT per tablet Take 1 tablet by mouth 2 (two) times daily.      . clopidogrel (PLAVIX) 75 MG tablet Take 75 mg by mouth every morning.      . colchicine 0.6 MG tablet Take 0.6-1.2 mg by mouth daily as needed. For flare  Takes 2 tablets at onset then 1 tablet one hour later      . diphenhydrAMINE (BENADRYL) 25 MG tablet Take 25 mg by mouth every 4 (four) hours as needed.      . fluticasone-salmeterol (ADVAIR HFA) 115-21 MCG/ACT inhaler Inhale 2 puffs into the lungs every 12 (twelve) hours.      Marland Kitchen ibuprofen (ADVIL,MOTRIN) 800 MG tablet Take 800 mg by mouth every 8 (eight) hours as needed. For pain      . ipratropium (ATROVENT HFA) 17 MCG/ACT inhaler Inhale 3 puffs into the lungs every 4 (four) hours.  While awake.      . ipratropium-albuterol (DUONEB) 0.5-2.5 (3) MG/3ML SOLN Take 3 mLs by nebulization every 4 (four) hours as needed. For shortness of breath      . lansoprazole (PREVACID) 30 MG capsule Take 30 mg by mouth every 12 (twelve) hours.      Marland Kitchen loperamide (IMODIUM) 2 MG capsule Take by mouth as needed for diarrhea or loose stools.      . meperidine (DEMEROL) 50 MG tablet Take 50 mg by mouth every 12 (twelve) hours.      . metoprolol succinate (TOPROL-XL) 100 MG 24 hr tablet Take 100 mg by mouth every morning.  Take with or immediately following a meal.      . montelukast (SINGULAIR) 10 MG tablet Take 10 mg by mouth at bedtime.      . Multiple Vitamin (MULTIVITAMIN WITH MINERALS) TABS Take 1 tablet by mouth every morning.      . nitroGLYCERIN (NITRODUR - DOSED IN MG/24 HR) 0.4 mg/hr Place 1 patch onto the skin daily. Place in qam  Off at bedtime      . nitroGLYCERIN (NITROSTAT) 0.4 MG SL tablet Place 0.4 mg under the tongue every 5 (five) minutes as needed. For chest pain      . omega-3 acid ethyl esters (LOVAZA) 1 G capsule Take 2 g by mouth 2 times daily at 12 noon and 4 pm.      . ondansetron (ZOFRAN-ODT) 8 MG disintegrating tablet Take 8 mg by mouth every 8 (eight) hours as needed. For nausea      . oxymetazoline (AFRIN) 0.05 % nasal spray Place 1-2 sprays into both nostrils 2 (two) times daily.      . potassium chloride SA (K-DUR,KLOR-CON) 20 MEQ tablet Take 40 mEq by mouth every 12 (twelve) hours.       . promethazine (PHENERGAN) 25 MG tablet Take 25 mg by mouth every 4 (four) hours as needed. For nausea      . sertraline (ZOLOFT) 100 MG tablet Take 100 mg by mouth at bedtime.      . torsemide (DEMADEX) 20 MG tablet Take 40 mg by mouth every morning.       No current facility-administered medications for this visit.    Allergies:    Allergies  Allergen Reactions  . Augmentin [Amoxicillin-Pot Clavulanate] Other (See Comments)    welps  wheezing  . Diltiazem Anaphylaxis  .  Fentanyl And Related Other (See Comments)    "altered mental status" (05/31/2012)  . Quinolones Anaphylaxis  . Amlodipine Swelling    Social History:  The patient  reports that she has quit smoking. Her smoking use included Cigarettes. She has a 20 pack-year smoking history. She has never used smokeless tobacco. She reports that she does not drink alcohol or use illicit drugs.   Family history:   Family History  Problem Relation Age of Onset  . Stroke Mother   . Heart attack Father   . Heart attack Maternal Grandmother   . Stroke Paternal Grandfather   . Stroke Paternal Grandmother   . Hypertension Sister   . Hyperlipidemia Sister   . Hyperlipidemia Sister   . Hypertension Sister   . Hypertension Sister   . Hyperlipidemia Sister     ROS:  Please see the history of present illness.  All other systems reviewed and negative.   PHYSICAL EXAM: VS:  BP 108/66  Pulse 78  Ht 5\' 6"  (1.676 m)  Wt 210 lb 3.2 oz (95.346 kg)  BMI 33.94 kg/m2 Obese, well developed, in no acute distress HEENT: Pupils are equal round react to light accommodation extraocular movements are intact.  Neck: no JVDNo cervical lymphadenopathy. Cardiac: Regular rate and rhythm without murmurs rubs or gallops. Lungs:  clear to auscultation bilaterally, no wheezing, rhonchi or rales Abd: soft, mild tenderness left lower quadrant, positive bowel sounds all quadrants, Ext: no lower extremity edema.  2+ radial and dorsalis pedis pulses. Skin: warm and dry. Right cath site is well healed no hematoma or ecchymosis Neuro:  Grossly normal  EKG:    Sinus rhythm lateral T wave inversion inferior Q waves essentially unchanged from prior rate 78  beats per minute  ASSESSMENT AND PLAN:  Problem List Items Addressed This Visit   Obesity (Chronic)     Lifestyle modifications are essential for improvement in overall health.    CAD (coronary artery disease), CABG X 2 (RIMA-DX, SVG-RCA) 1998, SVG-RCA DES 2007 - Primary      Patient continues to complain of intermittent chest pain appears noncardiac in nature although she is convinced that it is her heart. She does not appear to be getting relief from nitroglycerin although sometimes she says it helps.  Her symptoms also occur with or without activity. She just had a left heart catheterization showing patent grafts and no cardiac cause for her chest pain. It was recommended that she followup with the GI doctor. Her blood pressure would not allow addition of Imdur were she's been on Axid in the past which she says interferes with her migraine medicine.  She says she continues to fare poorly and is requesting to be out of work until Monday.    Relevant Orders      EKG 12-Lead   Essential hypertension     Blood pressure well controlled on current medical therapy

## 2013-08-09 NOTE — Telephone Encounter (Signed)
Pt was here today and received note stating patient could go back to work on Monday. The note needs to state her restrictions,whether she can go back full time or what restriction she have. Please fax this asap,this afternoon if possible.Please fax to [phone]

## 2013-08-09 NOTE — Patient Instructions (Signed)
Follow up with Dr. Berry in 3 months 

## 2013-08-09 NOTE — Telephone Encounter (Signed)
Forward to [facility] Hager PA, Wilburn CorneliaJC Wildman LPN Please Bryn Gullingaddressthe letter and fax

## 2013-08-09 NOTE — Assessment & Plan Note (Signed)
Lifestyle modifications are essential for improvement in overall health.

## 2013-08-09 NOTE — Assessment & Plan Note (Signed)
Blood pressure well controlled on current medical therapy 

## 2013-08-10 NOTE — Telephone Encounter (Signed)
Calling about a return to work note stating whether or not she has restrictions or no restrictions .Marland Kitchen.Please fax note to [phone]  Thanks

## 2013-08-14 NOTE — Telephone Encounter (Signed)
Judie GrieveBryan to answer this request

## 2013-08-17 ENCOUNTER — Other Ambulatory Visit: Payer: Self-pay | Admitting: Cardiovascular Disease

## 2013-08-17 NOTE — Telephone Encounter (Signed)
Rx was sent to pharmacy electronically. 

## 2013-09-05 ENCOUNTER — Encounter: Payer: Self-pay | Admitting: *Deleted

## 2013-09-06 ENCOUNTER — Encounter: Payer: Self-pay | Admitting: *Deleted

## 2013-09-07 ENCOUNTER — Encounter: Payer: Self-pay | Admitting: Cardiovascular Disease

## 2013-09-11 ENCOUNTER — Telehealth: Payer: Self-pay | Admitting: Cardiovascular Disease

## 2013-09-11 NOTE — Telephone Encounter (Signed)
I received a voicemail to return a call to the patient September 04, 2013.  I called the patient back to find out she needed assistance with getting the history section of her cath report from [facility] corrected.  The cath was completed by Dr. Kirke CorinArida on 08/06/13 per the patient.  After researching the problem, I called the patient again on September 07, 2013, to inform her of the progress of the investigation and that I would be contacting her as soon as I had the complete resolution.  I left her a voicemail later the same day to call me back so I can let her know how to obtain her correction.  I called the patient again today and informed her the chart was corrected by Dr. Kirke CorinArida and she can pick up a copy of the correction in medical records at [facility].  I also instructed the patient to call me directly if there are any changes that were missed so that we can make sure the record is accurate.  The patient verbalized understanding and is to pick up her copy of the correction from [facility] Medical Record department.

## 2013-10-25 ENCOUNTER — Encounter: Payer: Self-pay | Admitting: Cardiovascular Disease

## 2013-11-05 ENCOUNTER — Encounter: Payer: Self-pay | Admitting: Cardiovascular Disease

## 2013-11-05 ENCOUNTER — Ambulatory Visit (INDEPENDENT_AMBULATORY_CARE_PROVIDER_SITE_OTHER): Payer: 59 | Admitting: Cardiovascular Disease

## 2013-11-05 ENCOUNTER — Ambulatory Visit: Payer: 59 | Admitting: Cardiovascular Disease

## 2013-11-05 VITALS — BP 151/78 | HR 61 | Ht 66.0 in | Wt 203.0 lb

## 2013-11-05 DIAGNOSIS — I2510 Atherosclerotic heart disease of native coronary artery without angina pectoris: Secondary | ICD-10-CM

## 2013-11-05 NOTE — Progress Notes (Signed)
Ms. Rita Morgan  Since today to discuss [facility]. She apparently had a bad experience at [facility] and wishes to transfer her [facility].   Runell GessJonathan J. Pattiann Solanki, M.D., FACP, [facility] CenterFACC, Earl LagosFAHA, [facility] Center[facility]SCAI [facility] [address] [place], [place]  [id number]  [phone] 11/05/2013 3:39 PM

## 2013-11-05 NOTE — Patient Instructions (Signed)
Call us if you need us

## 2013-12-28 ENCOUNTER — Ambulatory Visit: Payer: Self-pay

## 2014-05-30 ENCOUNTER — Encounter (HOSPITAL_COMMUNITY): Payer: Self-pay | Admitting: Cardiovascular Disease

## 2014-10-12 NOTE — Consult Note (Signed)
General Aspect 59 year old white female patient of Dr. Gwenlyn Found, [facility], with history of coronary disease including remote bypass grafting in 1998 with multiple interventions,  having episodes of angina over the past few weeks that she gasps for breath and stops her in her tracks, also has not been feeling well all week with GI episodes and feeling sick with vomiting and diarrhea which ended on Tuesday. Cardiology was consulted for unstable angina.  Yesterday she was very dizzy and short of breath, was having heavy pains in the center of the chest, not too severe. She has taken 6 nitroglycerins throughout the day, still with some pain in the center of her chest. She also had some nausea which she has taken Phenergan and Zofran. She called Dr. Ammie Ferrier office and they brought her in for an EKG. Given her sx they sent her to the ER.    She has had chronic episodic angina due to stress but she is usually is able to control this with NTG.  last seen by Dr. Gwenlyn Found 05/2013 in clinic.  She was having increasing episodes of chest pain similar to her symptoms prior to the stent placed in December. The chest pain is also associated with nausea and shortness of breath.   Other history includes hypertension hyperlipidemia obstructive sleep apnea on CPAP history of GERD and migraine headaches.   She was seen by Cecilie Kicks registered nurse practitioner 02/21/13 with accelerated angina. Myoview stress test 02/23/2013 was normal.   Present Illness Had a negative Myoview July of 2013, but then developed chest pain in December of 2013 undergoing cardiac catheterization revealing a patent free RIMA to the diagonal, a patent vein to the distal right with a patent stent, patent mid AV groove circumflex stent and 70% in-stent restenosis within the proximal LAD stent. restented her with an Xpedition drug-eluting stent and her symptoms resolved.   Physical Exam:  GEN well developed, well nourished, no acute distress    HEENT hearing intact to voice, moist oral mucosa   NECK supple   RESP normal resp effort  clear BS   CARD Regular rate and rhythm  No murmur   ABD denies tenderness  soft   EXTR negative edema   SKIN normal to palpation   NEURO motor/sensory function intact   PSYCH alert, A+O to time, place, person, good insight   Review of Systems:  Subjective/Chief Complaint chest pain,malaise   General: Fatigue  Weakness   Skin: No Complaints   ENT: No Complaints   Eyes: No Complaints   Neck: No Complaints   Respiratory: No Complaints   Cardiovascular: Chest pain or discomfort   Gastrointestinal: No Complaints   Genitourinary: No Complaints   Vascular: No Complaints   Musculoskeletal: No Complaints   Neurologic: No Complaints   Hematologic: No Complaints   Endocrine: No Complaints   Psychiatric: No Complaints   Review of Systems: All other systems were reviewed and found to be negative   Medications/Allergies Reviewed Medications/Allergies reviewed     Diverticulitis:    CAD:    hypertension:    Chronic migraine headaches:    COPD:    asthma:    sleep apnea:    Gastritis:    Myocardial Infarct: 1992   Surgery for fractured tibia and fibula:    fractured tibia:    arthroscopy:    D and C:    cabg: 1998   multiple stents:        Admit Diagnosis:   CHEST PAIN: Onset  Date: 04-Aug-2013, Status: Active, Description: CHEST PAIN  Home Medications: Medication Instructions Status  Prevacid 30 mg oral delayed release capsule 1 cap(s) orally every 12 hours  Active  Lovaza 1000 mg oral capsule 2 cap(s) orally every 12 hours  Active  Singulair 10 mg oral tablet 1 tab(s) orally once a day (at bedtime)  Active  Zoloft 100 mg oral tablet 1 tab(s) orally once a day (at bedtime)  Active  Lipitor 80 mg oral tablet 1 tab(s) orally once a day (at bedtime)  Active  Atrovent HFA 17 mcg/inh inhalation aerosol 3 puff(s) inhaled every 4 hours  Active   ibuprofen 800 mg oral tablet 1 tab(s) orally every 8 hours, As Needed Active  Fiorinal with Codeine oral capsule 1 cap(s) orally every 4 hours, As Needed Active  promethazine 25 mg oral tablet 1 tab(s) orally every 4 hours, As Needed - for Nausea, Vomiting Active  diphenhydrAMINE 25 mg oral tablet 1 tab(s) orally once a day as needed   Active  alprazolam 0.5 mg oral tablet 1 tab(s) orally every 8 hours, As Needed Active  Citracal + D 1 tab orally every 12 hours Active  torsemide 20 mg oral tablet 2 tabs (54m)orally once a day Active  Toprol-XL 100 mg oral tablet, extended release 1 tab(s) orally once a day (in the morning) Active  potassium chloride 20 mEq oral tablet, extended release 2 tabs (436m) orally every 12 hours Active  Plavix 75 mg oral tablet 1 tab(s) orally once a day (in the morning) Active  ondansetron 8 mg oral tablet 1 tab(s) orally every 8 hours, As Needed - for Nausea, Vomiting Active  nitroglycerin 0.4 mg/hr transdermal film, extended release 1 patch transdermal once a day (on in the morning and off at bedtime) Active  nitroglycerin 0.4 mg sublingual tablet 1 tab(s) sublingual every 5 minutes as needed for chest pain. *If no relief call md or go to emergency room* Active  multivitamin with minerals 1 tab(s) orally once a day Active  loperamide 2 mg oral tablet 1 tab(s) orally every 4 hours, As Needed Active  DuoNeb 2.5 mg-0.5 mg/3 mL inhalation solution 1 vial (3 milliliters) via nebulizer 4 times a day as needed for shortness of breath Active  Demerol HCl 50 mg oral tablet 1 tab(s) orally every 4 hours, As Needed Active  Aspirin Enteric Coated 81 mg oral delayed release tablet 1 tab(s) orally once a day (in the morning) Active  albuterol CFC free 90 mcg/inh inhalation aerosol 2 puff(s) inhaled every 4 hours, As Needed - for Shortness of Breath, for Wheezing  Active  Afrin 0.05% nasal spray 1 to 2 spray(s) nasal 2 times a day as needed Active  Advair HFA CFC free 115 mcg-21  mcg/inh inhalation aerosol 2 puff(s) inhaled every 12 hours Active   Lab Results:  Routine Chem:  14-Feb-15 05:42   Magnesium, Serum 2.4 (1.8-2.4 THERAPEUTIC RANGE: 4-7 mg/dL TOXIC: > 10 mg/dL  -----------------------)  Glucose, Serum  149  BUN 9  Creatinine (comp) 0.95  Sodium, Serum 137  Potassium, Serum 3.7  Chloride, Serum 101  CO2, Serum 31  Calcium (Total), Serum  8.4  Anion Gap  5  Osmolality (calc) 275  eGFR (African American) >60  eGFR (Non-African American) >60 (eGFR values <6090min/1.73 m2 may be an indication of chronic kidney disease (CKD). Calculated eGFR is useful in patients with stable renal function. The eGFR calculation will not be reliable in acutely ill patients when serum creatinine is changing rapidly. It  is not useful in  patients on dialysis. The eGFR calculation may not be applicable to patients at the low and high extremes of body sizes, pregnant women, and vegetarians.)  Cholesterol, Serum 178  Triglycerides, Serum  338  HDL (INHOUSE)  27  VLDL Cholesterol Calculated  68  LDL Cholesterol Calculated 83 (Result(s) reported on 04 Aug 2013 at 06:49AM.)  Cardiac:  13-Feb-15 14:53   Troponin I < 0.02 (0.00-0.05 0.05 ng/mL or less: NEGATIVE  Repeat testing in 3-6 hrs  if clinically indicated. >0.05 ng/mL: POTENTIAL  MYOCARDIAL INJURY. Repeat  testing in 3-6 hrs if  clinically indicated. NOTE: An increase or decrease  of 30% or more on serial  testing suggests a  clinically important change)  CPK-MB, Serum 0.9 (Result(s) reported on 03 Aug 2013 at 03:24PM.)    18:07   Troponin I < 0.02 (0.00-0.05 0.05 ng/mL or less: NEGATIVE  Repeat testing in 3-6 hrs  if clinically indicated. >0.05 ng/mL: POTENTIAL  MYOCARDIAL INJURY. Repeat  testing in 3-6 hrs if  clinically indicated. NOTE: An increase or decrease  of 30% or more on serial  testing suggests a  clinically important change)  14-Feb-15 00:40   Troponin I < 0.02 (0.00-0.05 0.05 ng/mL  or less: NEGATIVE  Repeat testing in 3-6 hrs  if clinically indicated. >0.05 ng/mL: POTENTIAL  MYOCARDIAL INJURY. Repeat  testing in 3-6 hrs if  clinically indicated. NOTE: An increase or decrease  of 30% or more on serial  testing suggests a  clinically important change)  Routine Hem:  14-Feb-15 05:42   WBC (CBC) 6.0  RBC (CBC)  3.76  Hemoglobin (CBC) 12.3  Hematocrit (CBC) 36.0  Platelet Count (CBC) 175  MCV 96  MCH 32.8  MCHC 34.2  RDW 12.9  Neutrophil % 56.0  Lymphocyte % 28.5  Monocyte % 10.5  Eosinophil % 3.9  Basophil % 1.1  Neutrophil # 3.3  Lymphocyte # 1.7  Monocyte # 0.6  Eosinophil # 0.2  Basophil # 0.1 (Result(s) reported on 04 Aug 2013 at 06:53AM.)   EKG:  Interpretation ELG shows NSR with nonspecific ST and  T wave ABN, PVCs noted   Radiology Results: XRay:    13-Feb-15 17:13, Chest PA and Lateral  Chest PA and Lateral   REASON FOR EXAM:    chest pain  COMMENTS:       PROCEDURE: DXR - DXR CHEST PA (OR AP) AND LATERAL  - Aug 03 2013  5:13PM     CLINICAL DATA:  Chest pain, history of myocardial infarctions    EXAM:  CHEST  2 VIEW    COMPARISON:  DG CHEST 2V dated 05/25/2012    FINDINGS:  Status post CABG. Heart size and vascular pattern normal. Lungs  clear. Stable elevation of the right diaphragm.   IMPRESSION:  No active cardiopulmonary disease.      Electronically Signed    By: Skipper Cliche M.D.    On: 08/03/2013 17:13         Verified By: Rachael Fee, M.D.,    Quinolones (fluoroquinolone) antibiotics: Anaphylaxis  Ciprofloxacin HCl: Anaphylaxis  Trovan: Anaphylaxis  Diltiazem: Anaphylaxis  Fentanyl: Alt Ment Status  Indocin: N/V  Sulfa drugs: Rash  Flagyl: Itching, Rash  Augmentin: Hives  Biaxin: GI Distress  Scopolamine: N/V/Diarrhea, Dizzy/Fainting  Tape: Blisters  Latex: Itching, Rash  Vital Signs/Nurse's Notes: **Vital Signs.:   13-Feb-15 20:14  Vital Signs Type Admission  Temperature Temperature (F)  98.5  Celsius 36.9  Temperature Source oral  Pulse Pulse 73  Respirations Respirations 18  Systolic BP Systolic BP 564  Diastolic BP (mmHg) Diastolic BP (mmHg) 73  Mean BP 89  Pulse Ox % Pulse Ox % 93  Pulse Ox Activity Level  At rest  Oxygen Delivery Room Air/ 21 %     Plan ASSESSMENT AND PLAN:  1.  Chest pain with history of coronary artery disease.  "I know the stent is blocking again".  cardiac enz negative, still with stuttering chest pain. She has multiple prior stents, prior in stent restenosis requiring placement of stent over prior stent. She is very concerned about restenosis and does not wish repeat CABG. Last stress test 9/14 with no ischemia, now with more chest pain.  --Suggested aggressive medical management,  she has been placed on the list cardiac cath with Dr. Fletcher Anon Monday first case if chest pain persists into Saturday/Sunday.   Continue aspirin, Plavix, metoprolol, and Lovenox.   2.  Asthma.  Respiratory status stable.   3.  Gastrointestinal issues with hiatal hernia motility issues,  p.r.n. medications.   4.  History of migraine,  p.r.n. medications.   5.  Hypokalemia.  potassium and magnesium being repleted   Electronic Signatures: Ida Rogue (MD)  (Signed 14-Feb-15 09:33)  Authored: General Aspect/Present Illness, History and Physical Exam, Review of System, Past Medical History, Health Issues, Home Medications, Labs, EKG , Radiology, Allergies, Vital Signs/Nurse's Notes, Impression/Plan   Last Updated: 14-Feb-15 09:33 by Ida Rogue (MD)

## 2014-10-12 NOTE — H&P (Signed)
PATIENT NAME:  Rita Morgan, Rita Morgan MR#:  [id number] DATE OF BIRTH:  1955/11/14  DATE OF ADMISSION:  08/03/2013  PRIMARY CARE PHYSICIAN: Bethann Punches, MD  CHIEF COMPLAINT: Not feeling well.   HISTORY OF PRESENT ILLNESS: This is a 59 year old female who has been having episodes of angina over the past few weeks that she gasps for breath and stops her in her tracks. She has not been feeling well all week with GI episodes and feeling sick with vomiting and diarrhea which ended on Tuesday.   Today got very dizzy and short of breath, slightly heavy pains in the center of the chest, not too severe. She has taken 6 nitroglycerins throughout the day, still with some pain in the center of her chest. She also had some nausea which she has taken Phenergan and Zofran. She called Dr. Rondel Baton office and they brought her in for an EKG and they saw some EKG changes. She has had a stress test within 6 months states that is normal. She normally follows with Dr. Allyson Sabal (?), cardiology, from [place]. Hospitalist services were contacted for further evaluation. First troponin in the ER negative.   PAST MEDICAL HISTORY: Asthma, coronary artery disease, hiatal hernia, motility issue, sphincter does not work very well in her stomach, uterine dysplasia, anemia, gout, migraine.   PAST SURGICAL HISTORY: CABG, complications status post CABG surgery, numerous D and Cs with Mirena inserted, T and A, arthroscopy of the meniscus, a Baker cyst, arthroscopy.   ALLERGIES: AUGMENTIN, BIAXIN, CIPRO, DILTIAZEM, FENTANYL, FLAGYL, INDOCIN, QUINOLONE, SCOPOLAMINE, SULFA, TROVAN, LATEX AND TAPE.   MEDICATIONS: Include Advair inhaler 150, two puffs every 12 hours; Afrin nasal spray p.r.n.;  albuterol inhaler 2 puffs every 4 hours; Xanax 0.5 mg every 8 hours p.r.n.; aspirin 81 mg daily; Atrovent inhaler 3 puffs every 4 hours; Citracal plus D 1 tablet q.12; Demerol hydrochloride 50 mg p.r.n.; diphenhydramine 25 mg one tablet as needed; DuoNeb 1  treatment inhaled as needed; Fiorinal with codeine 1 capsule every 4 hours p.r.n., ibuprofen 800 mg every 8 hours p.r.n., Lipitor 80 mg at bedtime; loperamide 2 mg oral tablet p.r.n. Lovaza 1000 mg two capsules twice a day; multivitamin with minerals once a day; nitroglycerin 0.4 mg sublingual p.r.n. pain; nitroglycerin 0.4 mg/hour patch in the a.m., off in the p.m.; Zofran 8 mg orally disintegrating tablet every 8 hours as needed; Plavix 75 mg daily; potassium chloride 40 mEq twice a day; Prevacid 30 mg twice a day; promethazine 25 mg every 4 hours as needed; Singulair 10 mg at bedtime; Toprol-XL 100 mg once a day; torsemide 40 mg daily; Zoloft 100 mg at bedtime.   SOCIAL HISTORY: Quit smoking 20 years ago. No alcohol. No drug use. Works as a Associate Professor.   FAMILY HISTORY: The men in her family have died in their 55s, the women in her family in their 65s. Her sister died at age 69 of a MI.   REVIEW OF SYSTEMS:  CONSTITUTIONAL: No fever or chills.  EYES: Does wear glasses.  ENT: No sore throat.  CARDIOVASCULAR: Positive for chest pain.  RESPIRATORY: Some shortness of breath.  GASTROINTESTINAL: Positive for some nausea, vomiting, and diarrhea.  GENITOURINARY: No burning on urination.  MUSCULOSKELETAL: Some migraine headache.  INTEGUMENT: No rashes.  NEUROLOGICAL: No fainting.  PSYCHIATRIC: On medication for depression.  ENDOCRINE: No thyroid problems.  HEMOLYMPHATIC: History of anemia in the past.   PHYSICAL EXAMINATION:  VITAL SIGNS: Temperature 98.2, pulse 78, respirations 18, blood pressure 118/68, pulse oximetry 95% on  room air.  GENERAL: No respiratory distress.  EYES: Conjunctivae and lids normal. Pupils equal, round, and reactive to light. Extraocular muscles intact. No nystagmus.  ENT: Tympanic membranes: No erythema. Nasal mucosa: No erythema.  THROAT: No erythema. No exudate seen. Lips and gums: No lesions.  NECK: No JVD. No bruits. No lymphadenopathy. No thyromegaly. No thyroid  nodules palpated.  LUNGS: Clear to auscultation. No use of accessory muscles to breathe. No rhonchi, rales, or wheeze heard.  CARDIOVASCULAR: S1, S2 normal. No gallops, rubs, or murmurs heard. Carotid upstroke 2+ bilaterally. No bruits.  EXTREMITIES: Dorsalis pedis pulses 2+ bilaterally. Trace edema of the lower extremity.  ABDOMEN: Soft, nontender. No organomegaly/splenomegaly. Normoactive bowel sounds. No masses felt.  LYMPHATIC: No lymph nodes in the neck.  MUSCULOSKELETAL: No clubbing, edema or cyanosis.  SKIN: No rash or ulcers seen.  NEUROLOGIC: Cranial nerves II through XII grossly intact. Deep tendon reflexes 2+ bilateral lower extremities.  PSYCHIATRIC: Oriented to person, place, and time.   LABORATORY AND RADIOLOGICAL DATA: Troponins x2 negative. Glucose 144, BUN 4, creatinine 0.91, sodium 136, potassium 2.7, chloride 99, CO2 of 33, calcium 9.2. Liver function tests: AST slightly elevated at 45. White blood cell count 7.9, H and H 13.9 and 40.5, platelet count of 212.   I asked the ER physician to order a magnesium.   EKG: Sinus tachycardia at 103 beats per minute, premature supraventricular complexes, Q waves inferiorly.   ASSESSMENT AND PLAN:  1.  Chest pain with history of coronary artery disease. The patient does not trust stress test, states she had a stress test negative within 6 months. I will get a cardiology consultation. Spoke with Dossie Arbourim Gollan MD, who will see her in the morning. We will get a third set of cardiac enzymes. Continue aspirin, Plavix, metoprolol, and Lovenox.  2.  Asthma. Respiratory status stable.  3.  Gastrointestinal issues with hiatal hernia motility issues, p.r.n. medications.  4.  History of migraine, p.r.n. medications.  5.  Hypokalemia. Could cause some symptoms. Will replace that IV and orally. Recheck in the a.m. Check a magnesium. If that is low, we will replace that too.   TIME SPENT ON ADMISSION: 60 minutes.    ____________________________ Herschell Dimesichard J. Renae GlossWieting, MD rjw:[id number] D: 08/03/2013 19:10:13 ET T: 08/03/2013 20:38:49 ET JOB#: [id number]  cc: Herschell Dimesichard J. Renae GlossWieting, MD, <Dictator> Danella PentonMark F. Miller, MD Salley ScarletICHARD J Clydine Parkison MD ELECTRONICALLY SIGNED 08/11/2013 14:05

## 2014-10-12 NOTE — Discharge Summary (Signed)
PATIENT NAME:  Rita Morgan, Rita Morgan MR#:  [id number] DATE OF BIRTH:  [birth year]  DATE OF ADMISSION:  08/03/2013 DATE OF DISCHARGE:  08/06/2013  DISCHARGE DIAGNOSES: 1.  Unstable angina.  2.  Chronic obstructive pulmonary disease/asthma.  3.  Atherosclerotic cardiovascular disease.  4.  Reflux esophagitis.  5.  Sleep apnea.  6.  Esophageal dysmotility.   DISCHARGE MEDICATIONS: 1.  Lipitor 80 mg at bedtime.  2.  Aspirin 81 mg daily. 3.  Albuterol nebulizer q.i.d. p.r.n.  4.  Nitrostat p.r.n.  5.  Nitroglycerin patch 0.4 mg topical daily.  6.  Fiorinal #3 one tab q. 4 p.r.n. pain. 7.  Potassium chloride 20 mEq q.i.d.  8.  Prevacid 30 mg b.i.d.  9.  Demerol 50 mg p.r.n. 10.  Torsemide 40 mg daily. 11.  Plavix 75 mg daily. 12.   Zofran p.r.n.  13.  Zoloft 200 mg daily.  14.  Lovaza 1 gram 2 b.i.d.  15.  Xanax 0.5 mg t.i.d. 16.  Singulair 10 mg at bedtime.  17. Colcrys 0.6 mg b.i.d.   REASON FOR ADMISSION: A 59 year old female who presents with chest pain. Please see H and P for HPI, past medical history, and physical exam.   HOSPITAL COURSE: The patient was admitted, continued to have stuttering chest pain off and on. It got better as the weekend went on. Cardiac enzymes were normal. She underwent heart catheterization Monday morning showing normal EF and open stents and bypass vessels. She was discharged on her usual medication and follow up with Dr. Bobette MoJohn Barry, [place] in [place]. If she were to have progressive pain, she will let Dr. Gery PrayBarry know.  ____________________________ Danella PentonMark F. Sanel Stemmer, MD mfm:[id number] D: 08/06/2013 15:07:40 ET T: 08/06/2013 15:17:04 ET JOB#: [id number]  cc: Danella PentonMark F. Kevyn Wengert, MD, <Dictator> Danella PentonMARK F Parveen Freehling MD ELECTRONICALLY SIGNED 08/07/2013 11:38

## 2015-03-03 ENCOUNTER — Other Ambulatory Visit: Payer: Self-pay | Admitting: Physician Assistant

## 2015-03-03 DIAGNOSIS — R1084 Generalized abdominal pain: Secondary | ICD-10-CM

## 2015-03-03 DIAGNOSIS — R1110 Vomiting, unspecified: Secondary | ICD-10-CM

## 2015-03-07 ENCOUNTER — Ambulatory Visit
Admission: RE | Admit: 2015-03-07 | Discharge: 2015-03-07 | Disposition: A | Discharge status: Home / Self Care | Payer: 59 | Source: Ambulatory Visit | Attending: Physician Assistant | Admitting: Physician Assistant

## 2015-03-07 DIAGNOSIS — K760 Fatty (change of) liver, not elsewhere classified: Secondary | ICD-10-CM | POA: Diagnosis not present

## 2015-03-07 DIAGNOSIS — R1110 Vomiting, unspecified: Secondary | ICD-10-CM

## 2015-03-07 DIAGNOSIS — R112 Nausea with vomiting, unspecified: Secondary | ICD-10-CM | POA: Diagnosis present

## 2015-03-07 DIAGNOSIS — R1084 Generalized abdominal pain: Secondary | ICD-10-CM | POA: Diagnosis present

## 2015-03-07 DIAGNOSIS — I2510 Atherosclerotic heart disease of native coronary artery without angina pectoris: Secondary | ICD-10-CM | POA: Diagnosis not present

## 2015-03-07 MED ORDER — IOHEXOL 350 MG/ML SOLN
150.0000 mL | Freq: Once | INTRAVENOUS | Status: AC | PRN
  Administered 2015-03-07: 125 mL via INTRAVENOUS

## 2015-03-07 MED ORDER — IOHEXOL 300 MG/ML  SOLN: 125.0000 mL | Freq: Once | INTRAMUSCULAR | Status: DC | PRN

## 2015-11-20 DIAGNOSIS — Z0000 Encounter for general adult medical examination without abnormal findings: Secondary | ICD-10-CM | POA: Diagnosis not present

## 2015-11-26 DIAGNOSIS — M659 Synovitis and tenosynovitis, unspecified: Secondary | ICD-10-CM | POA: Diagnosis not present

## 2015-11-26 DIAGNOSIS — J449 Chronic obstructive pulmonary disease, unspecified: Secondary | ICD-10-CM | POA: Diagnosis not present

## 2015-11-26 DIAGNOSIS — I200 Unstable angina: Secondary | ICD-10-CM | POA: Diagnosis not present

## 2015-11-26 DIAGNOSIS — M1A09X0 Idiopathic chronic gout, multiple sites, without tophus (tophi): Secondary | ICD-10-CM | POA: Diagnosis not present

## 2015-12-05 DIAGNOSIS — M25551 Pain in right hip: Secondary | ICD-10-CM | POA: Diagnosis not present

## 2015-12-05 DIAGNOSIS — M5136 Other intervertebral disc degeneration, lumbar region: Secondary | ICD-10-CM | POA: Diagnosis not present

## 2016-01-01 DIAGNOSIS — L439 Lichen planus, unspecified: Secondary | ICD-10-CM | POA: Diagnosis not present

## 2016-01-01 DIAGNOSIS — L309 Dermatitis, unspecified: Secondary | ICD-10-CM | POA: Diagnosis not present

## 2016-01-26 DIAGNOSIS — L438 Other lichen planus: Secondary | ICD-10-CM | POA: Diagnosis not present

## 2016-03-10 DIAGNOSIS — F4323 Adjustment disorder with mixed anxiety and depressed mood: Secondary | ICD-10-CM | POA: Diagnosis not present

## 2016-05-05 DIAGNOSIS — F4323 Adjustment disorder with mixed anxiety and depressed mood: Secondary | ICD-10-CM | POA: Diagnosis not present

## 2016-05-19 DIAGNOSIS — H524 Presbyopia: Secondary | ICD-10-CM | POA: Diagnosis not present

## 2016-05-26 DIAGNOSIS — F4323 Adjustment disorder with mixed anxiety and depressed mood: Secondary | ICD-10-CM | POA: Diagnosis not present

## 2016-06-02 DIAGNOSIS — E538 Deficiency of other specified B group vitamins: Secondary | ICD-10-CM | POA: Diagnosis not present

## 2016-06-02 DIAGNOSIS — E119 Type 2 diabetes mellitus without complications: Secondary | ICD-10-CM | POA: Diagnosis not present

## 2016-06-02 DIAGNOSIS — J449 Chronic obstructive pulmonary disease, unspecified: Secondary | ICD-10-CM | POA: Diagnosis not present

## 2016-06-02 DIAGNOSIS — I200 Unstable angina: Secondary | ICD-10-CM | POA: Diagnosis not present

## 2016-06-08 DIAGNOSIS — I200 Unstable angina: Secondary | ICD-10-CM | POA: Diagnosis not present

## 2016-06-08 DIAGNOSIS — Z0000 Encounter for general adult medical examination without abnormal findings: Secondary | ICD-10-CM | POA: Diagnosis not present

## 2016-06-08 DIAGNOSIS — E119 Type 2 diabetes mellitus without complications: Secondary | ICD-10-CM | POA: Diagnosis not present

## 2016-06-08 DIAGNOSIS — J431 Panlobular emphysema: Secondary | ICD-10-CM | POA: Diagnosis not present

## 2016-07-28 DIAGNOSIS — F4323 Adjustment disorder with mixed anxiety and depressed mood: Secondary | ICD-10-CM | POA: Diagnosis not present

## 2016-11-04 ENCOUNTER — Other Ambulatory Visit: Payer: Self-pay | Admitting: Internal Medicine

## 2016-11-04 DIAGNOSIS — Z1231 Encounter for screening mammogram for malignant neoplasm of breast: Secondary | ICD-10-CM

## 2016-11-05 ENCOUNTER — Ambulatory Visit
Admission: RE | Admit: 2016-11-05 | Discharge: 2016-11-05 | Disposition: A | Discharge status: Home / Self Care | Payer: 59 | Source: Ambulatory Visit | Attending: Internal Medicine | Admitting: Internal Medicine

## 2016-11-05 DIAGNOSIS — Z1231 Encounter for screening mammogram for malignant neoplasm of breast: Secondary | ICD-10-CM | POA: Insufficient documentation

## 2016-12-01 DIAGNOSIS — Z0000 Encounter for general adult medical examination without abnormal findings: Secondary | ICD-10-CM | POA: Diagnosis not present

## 2016-12-01 DIAGNOSIS — E119 Type 2 diabetes mellitus without complications: Secondary | ICD-10-CM | POA: Diagnosis not present

## 2016-12-07 DIAGNOSIS — J431 Panlobular emphysema: Secondary | ICD-10-CM | POA: Diagnosis not present

## 2016-12-07 DIAGNOSIS — E782 Mixed hyperlipidemia: Secondary | ICD-10-CM | POA: Diagnosis not present

## 2016-12-07 DIAGNOSIS — E119 Type 2 diabetes mellitus without complications: Secondary | ICD-10-CM | POA: Diagnosis not present

## 2016-12-07 DIAGNOSIS — I2510 Atherosclerotic heart disease of native coronary artery without angina pectoris: Secondary | ICD-10-CM | POA: Diagnosis not present

## 2017-05-20 DIAGNOSIS — Z0100 Encounter for examination of eyes and vision without abnormal findings: Secondary | ICD-10-CM | POA: Diagnosis not present

## 2017-12-11 IMAGING — MG MM DIGITAL SCREENING BILAT W/ CAD
6 series · 6 of 6 positions shown · non-contrast
Comparison: Previous exam(s).

CLINICAL DATA: Screening.

EXAM:
DIGITAL SCREENING BILATERAL MAMMOGRAM WITH CAD

[L CC]
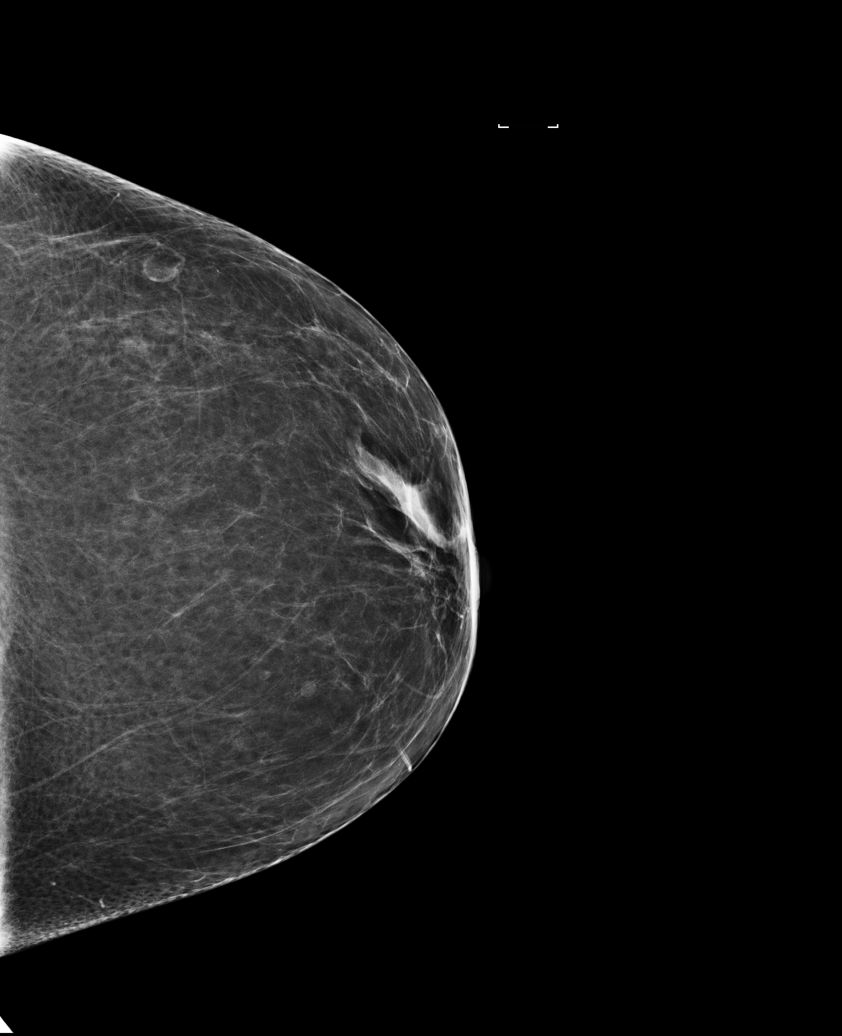

[L MLO]
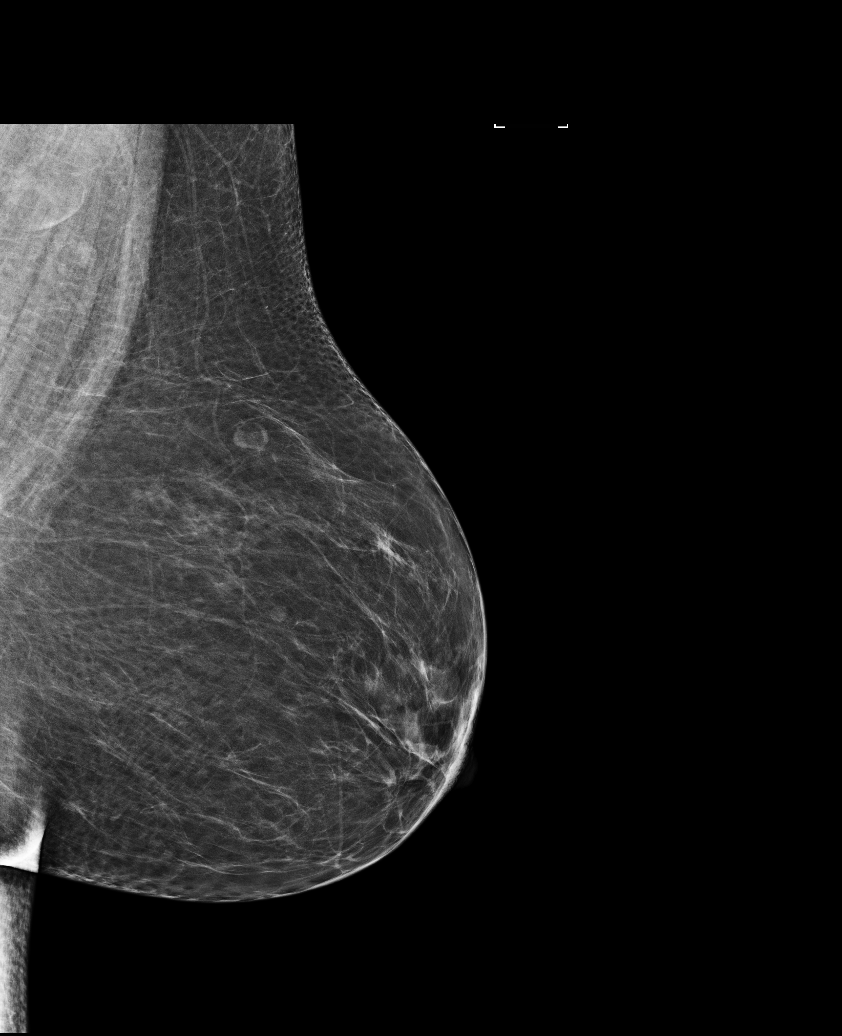

[R MLO (1 of 3)]
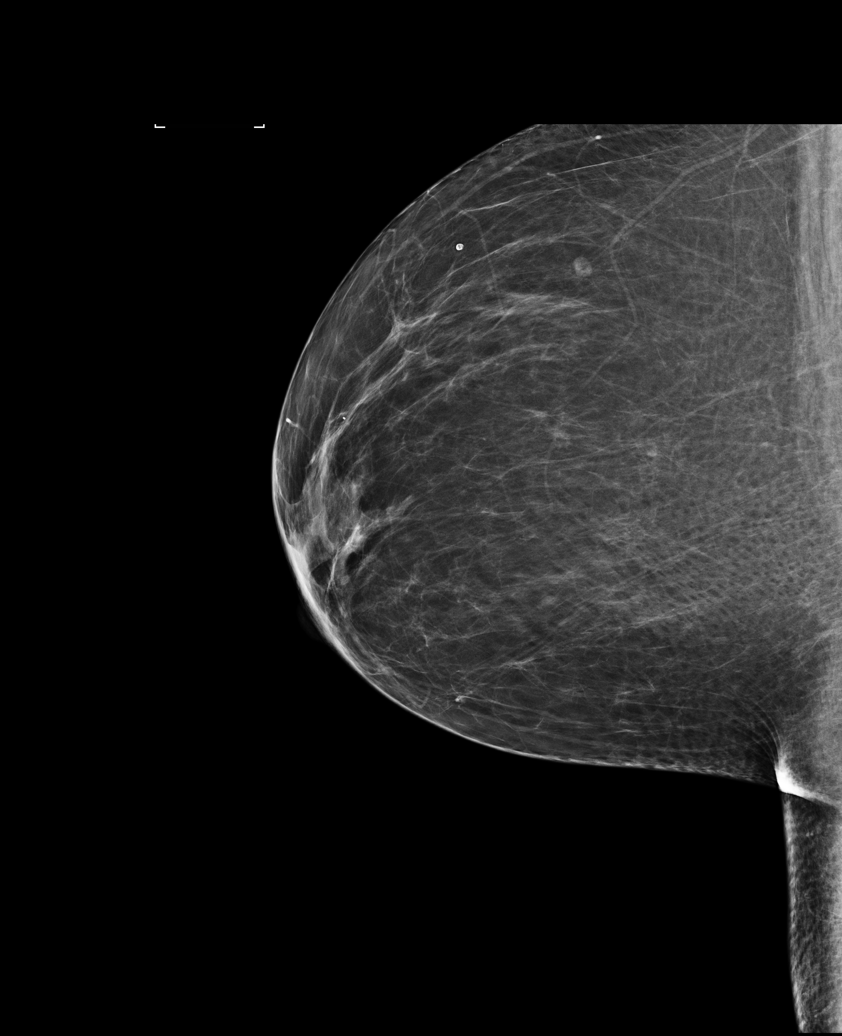

[R MLO (2 of 3)]
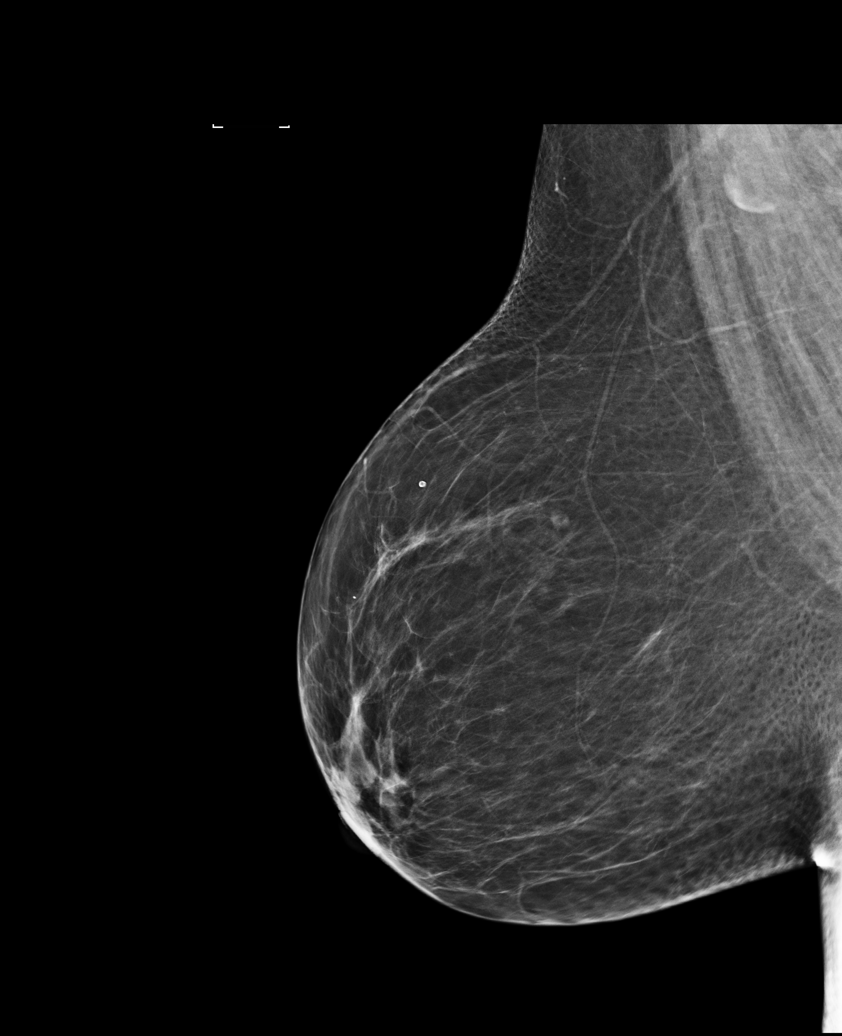

[R MLO (3 of 3)]
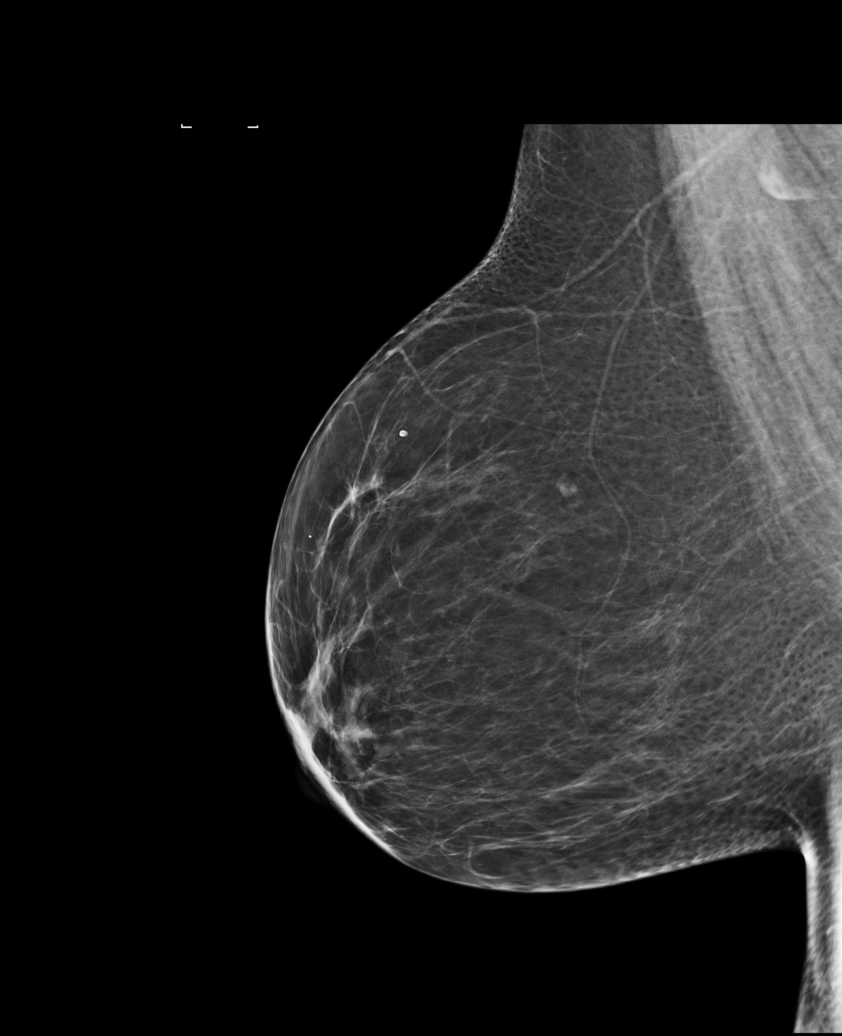

[R CC]
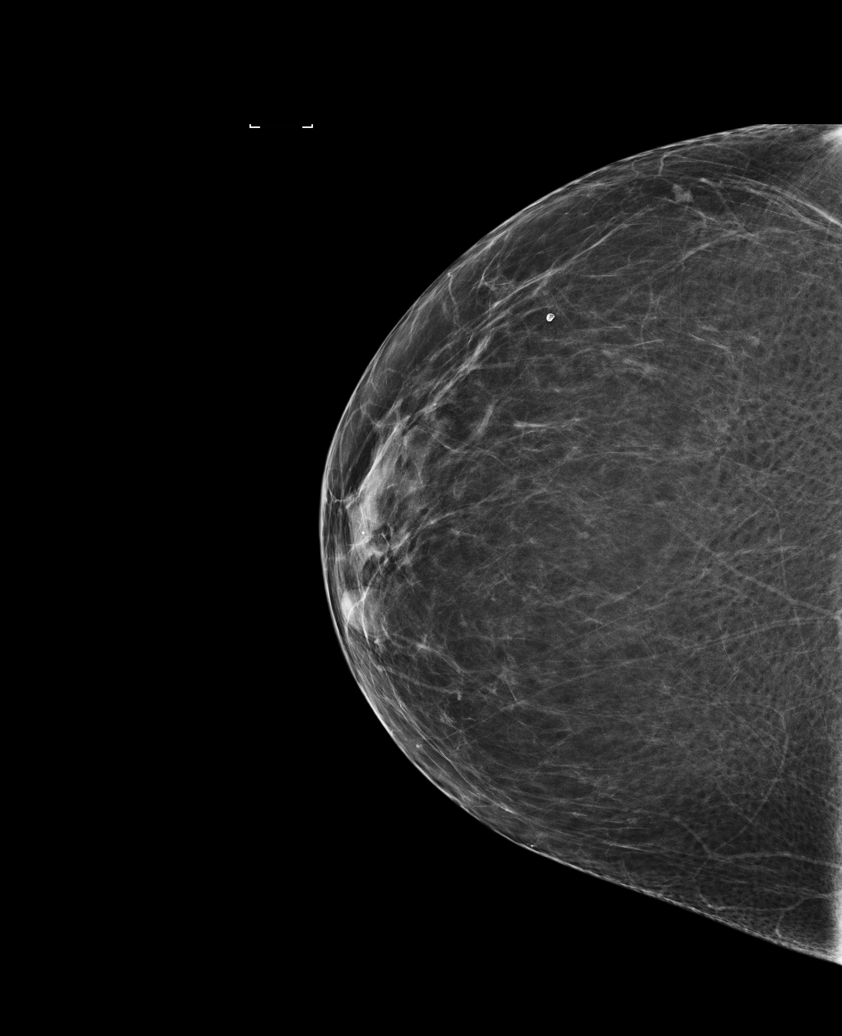

[6 of 6 positions shown; findings below may reference images not displayed]

ACR Breast Density Category b: There are scattered areas of
fibroglandular density.
FINDINGS: There are no findings suspicious for malignancy. Images were
processed with CAD.
IMPRESSION: No mammographic evidence of malignancy. A result letter of this
screening mammogram will be mailed directly to the patient.

RECOMMENDATION:
Screening mammogram in one year. (Code:AS-G-LCT)

BI-RADS CATEGORY  1: Negative.

## 2019-02-20 DIAGNOSIS — L0390 Cellulitis, unspecified: Secondary | ICD-10-CM

## 2019-02-20 HISTORY — DX: Cellulitis, unspecified: L03.90

## 2019-03-01 DIAGNOSIS — M01X9 Direct infection of multiple joints in infectious and parasitic diseases classified elsewhere: Secondary | ICD-10-CM | POA: Diagnosis not present

## 2019-03-01 DIAGNOSIS — M19021 Primary osteoarthritis, right elbow: Secondary | ICD-10-CM | POA: Diagnosis not present

## 2019-03-01 DIAGNOSIS — A498 Other bacterial infections of unspecified site: Secondary | ICD-10-CM | POA: Diagnosis not present

## 2019-03-06 DIAGNOSIS — E782 Mixed hyperlipidemia: Secondary | ICD-10-CM | POA: Diagnosis not present

## 2019-03-06 DIAGNOSIS — J432 Centrilobular emphysema: Secondary | ICD-10-CM | POA: Diagnosis not present

## 2019-03-06 DIAGNOSIS — H539 Unspecified visual disturbance: Secondary | ICD-10-CM | POA: Diagnosis not present

## 2019-03-06 DIAGNOSIS — E1151 Type 2 diabetes mellitus with diabetic peripheral angiopathy without gangrene: Secondary | ICD-10-CM | POA: Diagnosis not present

## 2019-03-09 DIAGNOSIS — H2513 Age-related nuclear cataract, bilateral: Secondary | ICD-10-CM | POA: Diagnosis not present

## 2019-03-29 DIAGNOSIS — H2511 Age-related nuclear cataract, right eye: Secondary | ICD-10-CM | POA: Diagnosis not present

## 2019-03-29 DIAGNOSIS — I2510 Atherosclerotic heart disease of native coronary artery without angina pectoris: Secondary | ICD-10-CM | POA: Diagnosis not present

## 2019-04-02 ENCOUNTER — Encounter: Payer: Self-pay | Admitting: *Deleted

## 2019-04-03 ENCOUNTER — Encounter: Payer: Self-pay | Admitting: *Deleted

## 2019-04-03 NOTE — Pre-Procedure Instructions (Signed)
Called doctors office regarding clarification of pre-op orders.

## 2019-04-10 ENCOUNTER — Other Ambulatory Visit: Payer: Self-pay

## 2019-04-10 ENCOUNTER — Other Ambulatory Visit
Admission: RE | Admit: 2019-04-10 | Discharge: 2019-04-10 | Disposition: A | Discharge status: Home / Self Care | Payer: PPO | Source: Ambulatory Visit | Attending: Ophthalmology | Admitting: Ophthalmology

## 2019-04-10 DIAGNOSIS — H2511 Age-related nuclear cataract, right eye: Secondary | ICD-10-CM | POA: Insufficient documentation

## 2019-04-10 DIAGNOSIS — Z01812 Encounter for preprocedural laboratory examination: Secondary | ICD-10-CM | POA: Diagnosis not present

## 2019-04-10 DIAGNOSIS — Z20828 Contact with and (suspected) exposure to other viral communicable diseases: Secondary | ICD-10-CM | POA: Insufficient documentation

## 2019-04-10 LAB — SARS CORONAVIRUS 2 (TAT 6-24 HRS): SARS Coronavirus 2: NEGATIVE

## 2019-04-12 ENCOUNTER — Encounter: Payer: Self-pay | Admitting: Anesthesiology

## 2019-04-13 ENCOUNTER — Encounter: Payer: Self-pay | Admitting: *Deleted

## 2019-04-13 ENCOUNTER — Ambulatory Visit: Payer: PPO | Admitting: Anesthesiology

## 2019-04-13 ENCOUNTER — Ambulatory Visit
Admission: RE | Admit: 2019-04-13 | Discharge: 2019-04-13 | Disposition: A | Discharge status: Home / Self Care | Payer: PPO | Attending: Ophthalmology | Admitting: Ophthalmology

## 2019-04-13 ENCOUNTER — Other Ambulatory Visit: Payer: Self-pay

## 2019-04-13 ENCOUNTER — Encounter
Admission: RE | Disposition: A | Discharge status: Home / Self Care | Payer: Self-pay | Source: Home / Self Care | Attending: Ophthalmology

## 2019-04-13 DIAGNOSIS — J449 Chronic obstructive pulmonary disease, unspecified: Secondary | ICD-10-CM | POA: Insufficient documentation

## 2019-04-13 DIAGNOSIS — Z79899 Other long term (current) drug therapy: Secondary | ICD-10-CM | POA: Insufficient documentation

## 2019-04-13 DIAGNOSIS — E1136 Type 2 diabetes mellitus with diabetic cataract: Secondary | ICD-10-CM | POA: Diagnosis not present

## 2019-04-13 DIAGNOSIS — I10 Essential (primary) hypertension: Secondary | ICD-10-CM | POA: Diagnosis not present

## 2019-04-13 DIAGNOSIS — Z951 Presence of aortocoronary bypass graft: Secondary | ICD-10-CM | POA: Insufficient documentation

## 2019-04-13 DIAGNOSIS — Z87891 Personal history of nicotine dependence: Secondary | ICD-10-CM | POA: Insufficient documentation

## 2019-04-13 DIAGNOSIS — K219 Gastro-esophageal reflux disease without esophagitis: Secondary | ICD-10-CM | POA: Insufficient documentation

## 2019-04-13 DIAGNOSIS — M1990 Unspecified osteoarthritis, unspecified site: Secondary | ICD-10-CM | POA: Diagnosis not present

## 2019-04-13 DIAGNOSIS — Z880 Allergy status to penicillin: Secondary | ICD-10-CM | POA: Insufficient documentation

## 2019-04-13 DIAGNOSIS — H2511 Age-related nuclear cataract, right eye: Secondary | ICD-10-CM | POA: Insufficient documentation

## 2019-04-13 DIAGNOSIS — G4730 Sleep apnea, unspecified: Secondary | ICD-10-CM | POA: Insufficient documentation

## 2019-04-13 DIAGNOSIS — Z888 Allergy status to other drugs, medicaments and biological substances status: Secondary | ICD-10-CM | POA: Insufficient documentation

## 2019-04-13 DIAGNOSIS — Z881 Allergy status to other antibiotic agents status: Secondary | ICD-10-CM | POA: Insufficient documentation

## 2019-04-13 DIAGNOSIS — I2510 Atherosclerotic heart disease of native coronary artery without angina pectoris: Secondary | ICD-10-CM | POA: Diagnosis not present

## 2019-04-13 DIAGNOSIS — Z882 Allergy status to sulfonamides status: Secondary | ICD-10-CM | POA: Diagnosis not present

## 2019-04-13 DIAGNOSIS — E119 Type 2 diabetes mellitus without complications: Secondary | ICD-10-CM | POA: Diagnosis not present

## 2019-04-13 DIAGNOSIS — Z7982 Long term (current) use of aspirin: Secondary | ICD-10-CM | POA: Diagnosis not present

## 2019-04-13 DIAGNOSIS — Z885 Allergy status to narcotic agent status: Secondary | ICD-10-CM | POA: Insufficient documentation

## 2019-04-13 DIAGNOSIS — M109 Gout, unspecified: Secondary | ICD-10-CM | POA: Diagnosis not present

## 2019-04-13 DIAGNOSIS — F329 Major depressive disorder, single episode, unspecified: Secondary | ICD-10-CM | POA: Insufficient documentation

## 2019-04-13 DIAGNOSIS — Z883 Allergy status to other anti-infective agents status: Secondary | ICD-10-CM | POA: Diagnosis not present

## 2019-04-13 DIAGNOSIS — F419 Anxiety disorder, unspecified: Secondary | ICD-10-CM | POA: Diagnosis not present

## 2019-04-13 DIAGNOSIS — Z955 Presence of coronary angioplasty implant and graft: Secondary | ICD-10-CM | POA: Insufficient documentation

## 2019-04-13 DIAGNOSIS — G43909 Migraine, unspecified, not intractable, without status migrainosus: Secondary | ICD-10-CM | POA: Insufficient documentation

## 2019-04-13 DIAGNOSIS — I252 Old myocardial infarction: Secondary | ICD-10-CM | POA: Diagnosis not present

## 2019-04-13 HISTORY — DX: Fatty (change of) liver, not elsewhere classified: K76.0

## 2019-04-13 HISTORY — DX: Type 2 diabetes mellitus without complications: E11.9

## 2019-04-13 HISTORY — DX: Other allergic rhinitis: J30.89

## 2019-04-13 HISTORY — DX: Dizziness and giddiness: R42

## 2019-04-13 HISTORY — DX: Diaphragmatic hernia without obstruction or gangrene: K44.9

## 2019-04-13 HISTORY — PX: CATARACT EXTRACTION W/PHACO: SHX586

## 2019-04-13 HISTORY — DX: Gastritis, unspecified, without bleeding: K29.70

## 2019-04-13 LAB — GLUCOSE, CAPILLARY
Glucose-Capillary: 152 mg/dL — ABNORMAL HIGH (ref 70–99)
Glucose-Capillary: 116 mg/dL — ABNORMAL HIGH (ref 70–99)

## 2019-04-13 SURGERY — PHACOEMULSIFICATION, CATARACT, WITH IOL INSERTION
Anesthesia: Monitor Anesthesia Care | Site: Eye | Laterality: Right

## 2019-04-13 SURGERY — Surgical Case
Anesthesia: *Unknown

## 2019-04-13 MED ORDER — PHENYLEPHRINE HCL 10 % OP SOLN
1.0000 [drp] | OPHTHALMIC | Status: DC
  Administered 2019-04-13 (×4): 1 [drp] via OPHTHALMIC

## 2019-04-13 MED ORDER — ONDANSETRON HCL 4 MG/2ML IJ SOLN
INTRAMUSCULAR | Status: AC
  Filled 2019-04-13: qty 2

## 2019-04-13 MED ORDER — POVIDONE-IODINE 5 % OP SOLN
OPHTHALMIC | Status: AC
  Filled 2019-04-13: qty 30

## 2019-04-13 MED ORDER — CARBACHOL 0.01 % IO SOLN
INTRAOCULAR | Status: DC | PRN
  Administered 2019-04-13: 0.5 mL via INTRAOCULAR

## 2019-04-13 MED ORDER — POVIDONE-IODINE 5 % OP SOLN
1.0000 "application " | OPHTHALMIC | Status: DC | PRN
  Administered 2019-04-13: 1 via OPHTHALMIC

## 2019-04-13 MED ORDER — POLYMYXIN B-TRIMETHOPRIM 10000-0.1 UNIT/ML-% OP SOLN
OPHTHALMIC | Status: AC
  Filled 2019-04-13: qty 10

## 2019-04-13 MED ORDER — POLYMYXIN B-TRIMETHOPRIM 10000-0.1 UNIT/ML-% OP SOLN: 1.0000 [drp] | Freq: Once | OPHTHALMIC | Status: DC

## 2019-04-13 MED ORDER — NA CHONDROIT SULF-NA HYALURON 40-17 MG/ML IO SOLN
INTRAOCULAR | Status: DC | PRN
  Administered 2019-04-13: 1 mL via INTRAOCULAR

## 2019-04-13 MED ORDER — LIDOCAINE HCL (PF) 4 % IJ SOLN
INTRAOCULAR | Status: DC | PRN
  Administered 2019-04-13: 4 mL via OPHTHALMIC

## 2019-04-13 MED ORDER — CYCLOPENTOLATE HCL 2 % OP SOLN
OPHTHALMIC | Status: AC
  Filled 2019-04-13: qty 2

## 2019-04-13 MED ORDER — MIDAZOLAM HCL 2 MG/2ML IJ SOLN
INTRAMUSCULAR | Status: DC | PRN
  Administered 2019-04-13: 1 mg via INTRAVENOUS

## 2019-04-13 MED ORDER — EPINEPHRINE PF 1 MG/ML IJ SOLN
INTRAOCULAR | Status: DC | PRN
  Administered 2019-04-13: 08:00:00 via OPHTHALMIC

## 2019-04-13 MED ORDER — PHENYLEPHRINE HCL 10 % OP SOLN
OPHTHALMIC | Status: AC
  Filled 2019-04-13: qty 5

## 2019-04-13 MED ORDER — LIDOCAINE HCL 3.5 % OP GEL
OPHTHALMIC | Status: AC
  Administered 2019-04-13: 07:00:00
  Filled 2019-04-13: qty 1

## 2019-04-13 MED ORDER — EPINEPHRINE PF 1 MG/ML IJ SOLN
INTRAMUSCULAR | Status: AC
  Filled 2019-04-13: qty 1

## 2019-04-13 MED ORDER — TETRACAINE HCL 0.5 % OP SOLN
1.0000 [drp] | Freq: Once | OPHTHALMIC | Status: AC
  Administered 2019-04-13: 07:00:00 1 [drp] via OPHTHALMIC

## 2019-04-13 MED ORDER — LIDOCAINE HCL (PF) 4 % IJ SOLN
INTRAMUSCULAR | Status: AC
  Filled 2019-04-13: qty 5

## 2019-04-13 MED ORDER — HYALURONIDASE HUMAN 150 UNIT/ML IJ SOLN
INTRAMUSCULAR | Status: AC
  Filled 2019-04-13: qty 1

## 2019-04-13 MED ORDER — MIDAZOLAM HCL 2 MG/2ML IJ SOLN
INTRAMUSCULAR | Status: AC
  Filled 2019-04-13: qty 2

## 2019-04-13 MED ORDER — CYCLOPENTOLATE HCL 2 % OP SOLN
1.0000 [drp] | OPHTHALMIC | Status: AC
  Administered 2019-04-13 (×4): 1 [drp] via OPHTHALMIC

## 2019-04-13 MED ORDER — TETRACAINE HCL 0.5 % OP SOLN
1.0000 [drp] | Freq: Once | OPHTHALMIC | Status: AC
  Administered 2019-04-13: 1 [drp] via OPHTHALMIC

## 2019-04-13 MED ORDER — NA CHONDROIT SULF-NA HYALURON 40-17 MG/ML IO SOLN
INTRAOCULAR | Status: AC
  Filled 2019-04-13: qty 1

## 2019-04-13 MED ORDER — POLYMYXIN B-TRIMETHOPRIM 10000-0.1 UNIT/ML-% OP SOLN
OPHTHALMIC | Status: DC | PRN
  Administered 2019-04-13: 1 [drp]

## 2019-04-13 MED ORDER — SODIUM CHLORIDE 0.9 % IV SOLN
INTRAVENOUS | Status: DC
  Administered 2019-04-13: 08:00:00 via INTRAVENOUS

## 2019-04-13 MED ORDER — BUPIVACAINE HCL (PF) 0.75 % IJ SOLN
INTRAMUSCULAR | Status: AC
  Filled 2019-04-13: qty 10

## 2019-04-13 MED ORDER — FENTANYL CITRATE (PF) 100 MCG/2ML IJ SOLN
INTRAMUSCULAR | Status: AC
  Filled 2019-04-13: qty 2

## 2019-04-13 MED ORDER — POVIDONE-IODINE 5 % OP SOLN
OPHTHALMIC | Status: DC | PRN
  Administered 2019-04-13: 1 via OPHTHALMIC

## 2019-04-13 MED ORDER — TETRACAINE HCL 0.5 % OP SOLN
OPHTHALMIC | Status: AC
  Administered 2019-04-13: 07:00:00 1 [drp] via OPHTHALMIC
  Filled 2019-04-13: qty 4

## 2019-04-13 MED ORDER — ONDANSETRON HCL 4 MG/2ML IJ SOLN
INTRAMUSCULAR | Status: DC | PRN
  Administered 2019-04-13: 4 mg via INTRAVENOUS

## 2019-04-13 SURGICAL SUPPLY — 16 items
GLOVE BIO SURGEON STRL SZ8 (GLOVE) ×2 IMPLANT
GLOVE BIOGEL M 6.5 STRL (GLOVE) ×2 IMPLANT
GLOVE SURG LX 8.0 MICRO (GLOVE) ×1
GLOVE SURG LX STRL 8.0 MICRO (GLOVE) ×1 IMPLANT
GOWN STRL REUS W/ TWL LRG LVL3 (GOWN DISPOSABLE) ×2 IMPLANT
GOWN STRL REUS W/TWL LRG LVL3 (GOWN DISPOSABLE) ×2
LABEL CATARACT MEDS ST (LABEL) ×2 IMPLANT
LENS IOL TECNIS ITEC 21.0 (Intraocular Lens) ×1 IMPLANT
PACK CATARACT (MISCELLANEOUS) ×2 IMPLANT
PACK CATARACT BRASINGTON LX (MISCELLANEOUS) ×2 IMPLANT
PACK EYE AFTER SURG (MISCELLANEOUS) ×2 IMPLANT
SOL BSS BAG (MISCELLANEOUS) ×2
SOLUTION BSS BAG (MISCELLANEOUS) ×1 IMPLANT
SYR 5ML LL (SYRINGE) ×2 IMPLANT
WATER STERILE IRR 250ML POUR (IV SOLUTION) ×2 IMPLANT
WIPE NON LINTING 3.25X3.25 (MISCELLANEOUS) ×2 IMPLANT

## 2019-04-13 NOTE — Anesthesia Postprocedure Evaluation (Addendum)
Anesthesia Post Note  Patient: Rita Morgan  Procedure(s) Performed: CATARACT EXTRACTION PHACO AND INTRAOCULAR LENS PLACEMENT (IOC) (Right Eye)  Patient location during evaluation: Phase II Anesthesia Type: MAC Level of consciousness: awake, oriented and awake and alert Pain management: pain level controlled Vital Signs Assessment: post-procedure vital signs reviewed and stable Respiratory status: spontaneous breathing Cardiovascular status: stable Anesthetic complications: no     Last Vitals:  Vitals:   04/13/19 0620 04/13/19 0755  BP: 124/74 117/66  Pulse: 77 69  Resp: 16 16  Temp: (!) 36.3 C (!) 36.3 C  SpO2: 93% 100%    Last Pain:  Vitals:   04/13/19 0755  TempSrc: Temporal  PainSc: 0-No pain                 Jaeveon Ashland Fletcher-Harrison

## 2019-04-13 NOTE — Transfer of Care (Signed)
Immediate Anesthesia Transfer of Care Note  Patient: Rita Morgan  Procedure(s) Performed: CATARACT EXTRACTION PHACO AND INTRAOCULAR LENS PLACEMENT (IOC) (Right Eye)  Patient Location: PACU  Anesthesia Type:MAC  Level of Consciousness: awake, alert , oriented and patient cooperative  Airway & Oxygen Therapy: Patient Spontanous Breathing  Post-op Assessment: Report given to RN and Post -op Vital signs reviewed and stable  Post vital signs: Reviewed and stable  Last Vitals:  Vitals Value Taken Time  BP    Temp    Pulse    Resp    SpO2      Last Pain:  Vitals:   04/13/19 0620  TempSrc: Tympanic  PainSc: 0-No pain         Complications: No apparent anesthesia complications

## 2019-04-13 NOTE — Anesthesia Preprocedure Evaluation (Signed)
Anesthesia Evaluation  Patient identified by MRN, date of birth, ID band Patient awake    Reviewed: Allergy & Precautions, NPO status , Patient's Chart, lab work & pertinent test results, reviewed documented beta blocker date and time   Airway Mallampati: III  TM Distance: >3 FB     Dental  (+) Chipped   Pulmonary shortness of breath, asthma , sleep apnea , pneumonia, resolved, COPD, former smoker,           Cardiovascular hypertension, Pt. on medications and Pt. on home beta blockers + angina + CAD, + Past MI, + Cardiac Stents and + CABG       Neuro/Psych  Headaches, PSYCHIATRIC DISORDERS Anxiety Depression CVA    GI/Hepatic hiatal hernia, GERD  Controlled,  Endo/Other  diabetes, Type 2  Renal/GU      Musculoskeletal  (+) Arthritis ,   Abdominal   Peds  Hematology  (+) anemia ,   Anesthesia Other Findings   Reproductive/Obstetrics                             Anesthesia Physical Anesthesia Plan  ASA: III  Anesthesia Plan: MAC   Post-op Pain Management:    Induction: Intravenous  PONV Risk Score and Plan:   Airway Management Planned:   Additional Equipment:   Intra-op Plan:   Post-operative Plan:   Informed Consent: I have reviewed the patients History and Physical, chart, labs and discussed the procedure including the risks, benefits and alternatives for the proposed anesthesia with the patient or authorized representative who has indicated his/her understanding and acceptance.       Plan Discussed with: CRNA  Anesthesia Plan Comments:         Anesthesia Quick Evaluation

## 2019-04-13 NOTE — Op Note (Signed)
PREOPERATIVE DIAGNOSIS:  Nuclear sclerotic cataract of the right eye.   POSTOPERATIVE DIAGNOSIS:  NUCLEAR SCLEROTIC CATARACT RIGHT EYE   OPERATIVE PROCEDURE: Procedure(s): CATARACT EXTRACTION PHACO AND INTRAOCULAR LENS PLACEMENT (IOC)   SURGEON:  Birder Robson, MD.   ANESTHESIA:  Anesthesiologist: Gunnar Bulla, MD CRNA: Kelton Pillar, CRNA  1.      Managed anesthesia care. 2.      0.95ml of Shugarcaine was instilled in the eye following the paracentesis.   COMPLICATIONS:  None.   TECHNIQUE:   Stop and chop   DESCRIPTION OF PROCEDURE:  The patient was examined and consented in the preoperative holding area where the aforementioned topical anesthesia was applied to the right eye and then brought back to the Operating Room where the right eye was prepped and draped in the usual sterile ophthalmic fashion and a lid speculum was placed. A paracentesis was created with the side port blade and the anterior chamber was filled with viscoelastic. A near clear corneal incision was performed with the steel keratome. A continuous curvilinear capsulorrhexis was performed with a cystotome followed by the capsulorrhexis forceps. Hydrodissection and hydrodelineation were carried out with BSS on a blunt cannula. The lens was removed in a stop and chop  technique and the remaining cortical material was removed with the irrigation-aspiration handpiece. The capsular bag was inflated with viscoelastic and the Technis ZCB00  lens was placed in the capsular bag without complication. The remaining viscoelastic was removed from the eye with the irrigation-aspiration handpiece. The wounds were hydrated. The anterior chamber was flushed with Miostat and the eye was inflated to physiologic pressure. . The wounds were found to be water tight. The eye was dressed with Polytrim. The patient was given protective glasses to wear throughout the day and a shield with which to sleep tonight. The patient was also  given drops with which to begin a drop regimen today and will follow-up with me in one day. Implant Name Type Inv. Item Serial No. Manufacturer Lot No. LRB No. Used Action  LENS IOL DIOP 21.0 - [id number] Intraocular Lens LENS IOL DIOP 21.0 [id number] 2001 [place]  Right 1 Implanted   Procedure(s) with comments: CATARACT EXTRACTION PHACO AND INTRAOCULAR LENS PLACEMENT (IOC) (Right) - [place] 00:34.8 CDE 5.72 Fluid Pack lot # [id number]  Electronically signed: Birder Robson 04/13/2019 7:53 AM

## 2019-04-13 NOTE — Discharge Instructions (Addendum)
Eye Surgery Discharge Instructions  Expect mild scratchy sensation or mild soreness. DO NOT RUB YOUR EYE!  The day of surgery:  Minimal physical activity, but bed rest is not required  No reading, computer work, or close hand work  No bending, lifting, or straining.  May watch TV  For 24 hours:  No driving, legal decisions, or alcoholic beverages  Safety precautions  Eat anything you prefer: It is better to start with liquids, then soup then solid foods.  Solar shield eyeglasses should be worn for comfort in the sunlight/patch while sleeping  Resume all regular medications including aspirin or Coumadin if these were discontinued prior to surgery. You may shower, bathe, shave, or wash your hair. Tylenol may be taken for mild discomfort. Follow eye drop instruction sheet as reviewed.  Call your doctor if you experience significant pain, nausea, or vomiting, fever > 101 or other signs of infection. [phone] or [phone] Specific instructions:  Follow-up Information    Birder Robson, MD Follow up.   Specialty: Ophthalmology Why: 04/13/19 @ 3:15 pm  Contact information: [place] [id number] [phone]

## 2019-04-13 NOTE — Anesthesia Post-op Follow-up Note (Signed)
Anesthesia QCDR form completed.        

## 2019-04-13 NOTE — H&P (Signed)
All labs reviewed. Abnormal studies sent to patients PCP when indicated.  Previous H&P reviewed, patient examined, there are NO CHANGES.  Rita Deloach Porfilio10/23/20207:24 AM

## 2019-04-30 DIAGNOSIS — H2512 Age-related nuclear cataract, left eye: Secondary | ICD-10-CM | POA: Diagnosis not present

## 2019-05-08 ENCOUNTER — Other Ambulatory Visit: Payer: PPO

## 2019-05-11 ENCOUNTER — Ambulatory Visit: Admit: 2019-05-11 | Payer: PPO | Admitting: Ophthalmology

## 2019-05-11 SURGERY — PHACOEMULSIFICATION, CATARACT, WITH IOL INSERTION
Anesthesia: Topical | Laterality: Left

## 2019-05-15 NOTE — Discharge Instructions (Signed)

## 2019-05-18 ENCOUNTER — Other Ambulatory Visit
Admission: RE | Admit: 2019-05-18 | Discharge: 2019-05-18 | Disposition: A | Discharge status: Home / Self Care | Payer: PPO | Source: Ambulatory Visit | Attending: Ophthalmology | Admitting: Ophthalmology

## 2019-05-18 ENCOUNTER — Other Ambulatory Visit: Payer: Self-pay

## 2019-05-18 DIAGNOSIS — Z20828 Contact with and (suspected) exposure to other viral communicable diseases: Secondary | ICD-10-CM | POA: Insufficient documentation

## 2019-05-18 DIAGNOSIS — Z01812 Encounter for preprocedural laboratory examination: Secondary | ICD-10-CM | POA: Insufficient documentation

## 2019-05-18 LAB — SARS CORONAVIRUS 2 (TAT 6-24 HRS): SARS Coronavirus 2: NEGATIVE

## 2019-05-21 ENCOUNTER — Encounter: Payer: Self-pay | Admitting: *Deleted

## 2019-05-21 ENCOUNTER — Other Ambulatory Visit: Payer: Self-pay

## 2019-05-21 NOTE — Anesthesia Preprocedure Evaluation (Addendum)
Anesthesia Evaluation  Patient identified by MRN, date of birth, ID band Patient awake    Reviewed: Allergy & Precautions, NPO status , Patient's Chart, lab work & pertinent test results  History of Anesthesia Complications Negative for: history of anesthetic complications  Airway Mallampati: II  TM Distance: >3 FB Neck ROM: Full    Dental   Pulmonary asthma , sleep apnea , COPD, former smoker,    breath sounds clear to auscultation       Cardiovascular hypertension, (-) angina+ CAD, + Past MI and + CABG  (-) DOE  Rhythm:Regular Rate:Normal   HLD   Neuro/Psych  Headaches, PSYCHIATRIC DISORDERS Anxiety Depression CVA    GI/Hepatic hiatal hernia, GERD  , Fatty liver disease   Endo/Other  diabetes Gout  Renal/GU      Musculoskeletal  (+) Arthritis ,   Abdominal   Peds  Hematology  (+) Blood dyscrasia, anemia ,   Anesthesia Other Findings   Reproductive/Obstetrics                            Anesthesia Physical Anesthesia Plan  ASA: III  Anesthesia Plan: MAC   Post-op Pain Management:    Induction: Intravenous  PONV Risk Score and Plan: 2 and TIVA and Midazolam  Airway Management Planned: Nasal Cannula  Additional Equipment:   Intra-op Plan:   Post-operative Plan:   Informed Consent: I have reviewed the patients History and Physical, chart, labs and discussed the procedure including the risks, benefits and alternatives for the proposed anesthesia with the patient or authorized representative who has indicated his/her understanding and acceptance.       Plan Discussed with: CRNA and Anesthesiologist  Anesthesia Plan Comments:         Anesthesia Quick Evaluation

## 2019-05-22 ENCOUNTER — Ambulatory Visit
Admission: RE | Admit: 2019-05-22 | Discharge: 2019-05-22 | Disposition: A | Discharge status: Home / Self Care | Payer: PPO | Attending: Ophthalmology | Admitting: Ophthalmology

## 2019-05-22 ENCOUNTER — Encounter
Admission: RE | Disposition: A | Discharge status: Home / Self Care | Payer: Self-pay | Source: Home / Self Care | Attending: Ophthalmology

## 2019-05-22 ENCOUNTER — Other Ambulatory Visit: Payer: Self-pay

## 2019-05-22 ENCOUNTER — Ambulatory Visit: Payer: PPO | Admitting: Anesthesiology

## 2019-05-22 DIAGNOSIS — Z951 Presence of aortocoronary bypass graft: Secondary | ICD-10-CM | POA: Diagnosis not present

## 2019-05-22 DIAGNOSIS — E1136 Type 2 diabetes mellitus with diabetic cataract: Secondary | ICD-10-CM | POA: Insufficient documentation

## 2019-05-22 DIAGNOSIS — Z882 Allergy status to sulfonamides status: Secondary | ICD-10-CM | POA: Diagnosis not present

## 2019-05-22 DIAGNOSIS — G4730 Sleep apnea, unspecified: Secondary | ICD-10-CM | POA: Diagnosis not present

## 2019-05-22 DIAGNOSIS — Z955 Presence of coronary angioplasty implant and graft: Secondary | ICD-10-CM | POA: Diagnosis not present

## 2019-05-22 DIAGNOSIS — Z8673 Personal history of transient ischemic attack (TIA), and cerebral infarction without residual deficits: Secondary | ICD-10-CM | POA: Insufficient documentation

## 2019-05-22 DIAGNOSIS — K219 Gastro-esophageal reflux disease without esophagitis: Secondary | ICD-10-CM | POA: Diagnosis not present

## 2019-05-22 DIAGNOSIS — I252 Old myocardial infarction: Secondary | ICD-10-CM | POA: Insufficient documentation

## 2019-05-22 DIAGNOSIS — H25812 Combined forms of age-related cataract, left eye: Secondary | ICD-10-CM | POA: Diagnosis not present

## 2019-05-22 DIAGNOSIS — F329 Major depressive disorder, single episode, unspecified: Secondary | ICD-10-CM | POA: Insufficient documentation

## 2019-05-22 DIAGNOSIS — Z885 Allergy status to narcotic agent status: Secondary | ICD-10-CM | POA: Diagnosis not present

## 2019-05-22 DIAGNOSIS — Z880 Allergy status to penicillin: Secondary | ICD-10-CM | POA: Insufficient documentation

## 2019-05-22 DIAGNOSIS — J449 Chronic obstructive pulmonary disease, unspecified: Secondary | ICD-10-CM | POA: Insufficient documentation

## 2019-05-22 DIAGNOSIS — M1990 Unspecified osteoarthritis, unspecified site: Secondary | ICD-10-CM | POA: Diagnosis not present

## 2019-05-22 DIAGNOSIS — I10 Essential (primary) hypertension: Secondary | ICD-10-CM | POA: Insufficient documentation

## 2019-05-22 DIAGNOSIS — I2510 Atherosclerotic heart disease of native coronary artery without angina pectoris: Secondary | ICD-10-CM | POA: Insufficient documentation

## 2019-05-22 DIAGNOSIS — G43909 Migraine, unspecified, not intractable, without status migrainosus: Secondary | ICD-10-CM | POA: Insufficient documentation

## 2019-05-22 DIAGNOSIS — F419 Anxiety disorder, unspecified: Secondary | ICD-10-CM | POA: Insufficient documentation

## 2019-05-22 DIAGNOSIS — Z888 Allergy status to other drugs, medicaments and biological substances status: Secondary | ICD-10-CM | POA: Diagnosis not present

## 2019-05-22 DIAGNOSIS — H2512 Age-related nuclear cataract, left eye: Secondary | ICD-10-CM | POA: Insufficient documentation

## 2019-05-22 DIAGNOSIS — Z881 Allergy status to other antibiotic agents status: Secondary | ICD-10-CM | POA: Diagnosis not present

## 2019-05-22 DIAGNOSIS — Z87891 Personal history of nicotine dependence: Secondary | ICD-10-CM | POA: Diagnosis not present

## 2019-05-22 DIAGNOSIS — M109 Gout, unspecified: Secondary | ICD-10-CM | POA: Insufficient documentation

## 2019-05-22 DIAGNOSIS — Z883 Allergy status to other anti-infective agents status: Secondary | ICD-10-CM | POA: Insufficient documentation

## 2019-05-22 HISTORY — PX: CATARACT EXTRACTION W/PHACO: SHX586

## 2019-05-22 LAB — GLUCOSE, CAPILLARY
Glucose-Capillary: 145 mg/dL — ABNORMAL HIGH (ref 70–99)
Glucose-Capillary: 113 mg/dL — ABNORMAL HIGH (ref 70–99)

## 2019-05-22 SURGERY — PHACOEMULSIFICATION, CATARACT, WITH IOL INSERTION
Anesthesia: Monitor Anesthesia Care | Site: Eye | Laterality: Left

## 2019-05-22 MED ORDER — LIDOCAINE HCL (CARDIAC) PF 100 MG/5ML IV SOSY
PREFILLED_SYRINGE | INTRAVENOUS | Status: DC | PRN
  Administered 2019-05-22: 40 mg via INTRAVENOUS

## 2019-05-22 MED ORDER — ONDANSETRON HCL 4 MG/2ML IJ SOLN: 4.0000 mg | Freq: Once | INTRAMUSCULAR | Status: DC | PRN

## 2019-05-22 MED ORDER — NA CHONDROIT SULF-NA HYALURON 40-17 MG/ML IO SOLN
INTRAOCULAR | Status: DC | PRN
  Administered 2019-05-22: 1 mL via INTRAOCULAR

## 2019-05-22 MED ORDER — CYCLOPENTOLATE HCL 2 % OP SOLN
1.0000 [drp] | OPHTHALMIC | Status: DC | PRN
  Administered 2019-05-22 (×2): 1 [drp] via OPHTHALMIC

## 2019-05-22 MED ORDER — ACETAMINOPHEN 10 MG/ML IV SOLN: 1000.0000 mg | Freq: Once | INTRAVENOUS | Status: DC | PRN

## 2019-05-22 MED ORDER — TETRACAINE HCL 0.5 % OP SOLN
1.0000 [drp] | OPHTHALMIC | Status: DC | PRN
  Administered 2019-05-22 (×3): 1 [drp] via OPHTHALMIC

## 2019-05-22 MED ORDER — BRIMONIDINE TARTRATE-TIMOLOL 0.2-0.5 % OP SOLN
OPHTHALMIC | Status: DC | PRN
  Administered 2019-05-22: 1 [drp] via OPHTHALMIC

## 2019-05-22 MED ORDER — CEFUROXIME OPHTHALMIC INJECTION 1 MG/0.1 ML
INJECTION | OPHTHALMIC | Status: DC | PRN
  Administered 2019-05-22: 0.1 mL via INTRACAMERAL

## 2019-05-22 MED ORDER — LACTATED RINGERS IV SOLN: 100.0000 mL/h | INTRAVENOUS | Status: DC

## 2019-05-22 MED ORDER — MIDAZOLAM HCL 2 MG/2ML IJ SOLN
INTRAMUSCULAR | Status: DC | PRN
  Administered 2019-05-22: 2 mg via INTRAVENOUS

## 2019-05-22 MED ORDER — EPINEPHRINE PF 1 MG/ML IJ SOLN
INTRAOCULAR | Status: DC | PRN
  Administered 2019-05-22: 47 mL via OPHTHALMIC

## 2019-05-22 MED ORDER — LIDOCAINE HCL (PF) 2 % IJ SOLN
INTRAOCULAR | Status: DC | PRN
  Administered 2019-05-22: 1 mL

## 2019-05-22 MED ORDER — PHENYLEPHRINE HCL 10 % OP SOLN
1.0000 [drp] | OPHTHALMIC | Status: DC | PRN
  Administered 2019-05-22 (×2): 1 [drp] via OPHTHALMIC

## 2019-05-22 SURGICAL SUPPLY — 18 items
CANNULA ANT/CHMB 27GA (MISCELLANEOUS) ×4 IMPLANT
GLOVE SURG LX 8.0 MICRO (GLOVE) ×1
GLOVE SURG LX STRL 8.0 MICRO (GLOVE) ×1 IMPLANT
GLOVE SURG TRIUMPH 8.0 PF LTX (GLOVE) ×2 IMPLANT
GOWN STRL REUS W/ TWL LRG LVL3 (GOWN DISPOSABLE) ×2 IMPLANT
GOWN STRL REUS W/TWL LRG LVL3 (GOWN DISPOSABLE) ×2
LENS IOL TECNIS ITEC 21.5 (Intraocular Lens) ×2 IMPLANT
MARKER SKIN DUAL TIP RULER LAB (MISCELLANEOUS) ×2 IMPLANT
NDL RETROBULBAR .5 NSTRL (NEEDLE) ×2 IMPLANT
NEEDLE FILTER BLUNT 18X 1/2SAF (NEEDLE) ×1
NEEDLE FILTER BLUNT 18X1 1/2 (NEEDLE) ×1 IMPLANT
PACK EYE AFTER SURG (MISCELLANEOUS) ×2 IMPLANT
PACK OPTHALMIC (MISCELLANEOUS) ×2 IMPLANT
PACK PORFILIO (MISCELLANEOUS) ×2 IMPLANT
SYR 3ML LL SCALE MARK (SYRINGE) ×2 IMPLANT
SYR TB 1ML LUER SLIP (SYRINGE) ×2 IMPLANT
WATER STERILE IRR 250ML POUR (IV SOLUTION) ×2 IMPLANT
WIPE NON LINTING 3.25X3.25 (MISCELLANEOUS) ×2 IMPLANT

## 2019-05-22 NOTE — Anesthesia Postprocedure Evaluation (Signed)
Anesthesia Post Note  Patient: Rita Morgan  Procedure(s) Performed: CATARACT EXTRACTION PHACO AND INTRAOCULAR LENS PLACEMENT (IOC) LEFT DIABETIC 6.99  00:43.1 (Left Eye)     Patient location during evaluation: PACU Anesthesia Type: MAC Level of consciousness: awake and alert Pain management: pain level controlled Vital Signs Assessment: post-procedure vital signs reviewed and stable Respiratory status: spontaneous breathing, nonlabored ventilation, respiratory function stable and patient connected to nasal cannula oxygen Cardiovascular status: stable and blood pressure returned to baseline Postop Assessment: no apparent nausea or vomiting Anesthetic complications: no    Scot Shiraishi A  Emeree Mahler

## 2019-05-22 NOTE — Transfer of Care (Signed)
Immediate Anesthesia Transfer of Care Note  Patient: Rita Morgan  Procedure(s) Performed: CATARACT EXTRACTION PHACO AND INTRAOCULAR LENS PLACEMENT (IOC) LEFT DIABETIC 6.99  00:43.1 (Left Eye)  Patient Location: PACU  Anesthesia Type: MAC  Level of Consciousness: awake, alert  and patient cooperative  Airway and Oxygen Therapy: Patient Spontanous Breathing and Patient connected to supplemental oxygen  Post-op Assessment: Post-op Vital signs reviewed, Patient's Cardiovascular Status Stable, Respiratory Function Stable, Patent Airway and No signs of Nausea or vomiting  Post-op Vital Signs: Reviewed and stable  Complications: No apparent anesthesia complications

## 2019-05-22 NOTE — Op Note (Signed)
PREOPERATIVE DIAGNOSIS:  Nuclear sclerotic cataract of the left eye.   POSTOPERATIVE DIAGNOSIS:  Nuclear sclerotic cataract of the left eye.   OPERATIVE PROCEDURE:@   SURGEON:  Birder Robson, MD.   ANESTHESIA:  Anesthesiologist: Heniser, Fredric Dine, MD CRNA: Izetta Dakin, CRNA  1.      Managed anesthesia care. 2.     0.84ml of Shugarcaine was instilled following the paracentesis   COMPLICATIONS:  None.   TECHNIQUE:   Stop and chop   DESCRIPTION OF PROCEDURE:  The patient was examined and consented in the preoperative holding area where the aforementioned topical anesthesia was applied to the left eye and then brought back to the Operating Room where the left eye was prepped and draped in the usual sterile ophthalmic fashion and a lid speculum was placed. A paracentesis was created with the side port blade and the anterior chamber was filled with viscoelastic. A near clear corneal incision was performed with the steel keratome. A continuous curvilinear capsulorrhexis was performed with a cystotome followed by the capsulorrhexis forceps. Hydrodissection and hydrodelineation were carried out with BSS on a blunt cannula. The lens was removed in a stop and chop  technique and the remaining cortical material was removed with the irrigation-aspiration handpiece. The capsular bag was inflated with viscoelastic and the Technis ZCB00 lens was placed in the capsular bag without complication. The remaining viscoelastic was removed from the eye with the irrigation-aspiration handpiece. The wounds were hydrated. The anterior chamber was flushed with BSS and the eye was inflated to physiologic pressure. 0.28ml Vigamox was placed in the anterior chamber. The wounds were found to be water tight. The eye was dressed with Combigan. The patient was given protective glasses to wear throughout the day and a shield with which to sleep tonight. The patient was also given drops with which to begin a drop regimen today  and will follow-up with me in one day. Implant Name Type Inv. Item Serial No. Manufacturer Lot No. LRB No. Used Action  LENS IOL DIOP 21.5 - [id number] Intraocular Lens LENS IOL DIOP 21.5 [id number]  Left 1 Implanted    Procedure(s) with comments: CATARACT EXTRACTION PHACO AND INTRAOCULAR LENS PLACEMENT (IOC) LEFT DIABETIC 6.99  00:43.1 (Left) - Diabetic - injectable  Electronically signed: Birder Robson 05/22/2019 9:59 AM

## 2019-05-22 NOTE — Anesthesia Procedure Notes (Signed)
Procedure Name: MAC Performed by: Kennedy Brines M, CRNA Pre-anesthesia Checklist: Timeout performed, Patient being monitored, Suction available, Emergency Drugs available and Patient identified Patient Re-evaluated:Patient Re-evaluated prior to induction Oxygen Delivery Method: Nasal cannula       

## 2019-05-22 NOTE — H&P (Signed)
All labs reviewed. Abnormal studies sent to patients PCP when indicated.  Previous H&P reviewed, patient examined, there are NO CHANGES.  Abdulrahim Siddiqi Porfilio12/1/20209:28 AM

## 2019-05-23 ENCOUNTER — Encounter: Payer: Self-pay | Admitting: Ophthalmology

## 2019-07-09 DIAGNOSIS — E1151 Type 2 diabetes mellitus with diabetic peripheral angiopathy without gangrene: Secondary | ICD-10-CM | POA: Diagnosis not present

## 2019-07-16 DIAGNOSIS — J431 Panlobular emphysema: Secondary | ICD-10-CM | POA: Diagnosis not present

## 2019-07-16 DIAGNOSIS — I200 Unstable angina: Secondary | ICD-10-CM | POA: Diagnosis not present

## 2019-07-16 DIAGNOSIS — E782 Mixed hyperlipidemia: Secondary | ICD-10-CM | POA: Diagnosis not present

## 2019-07-16 DIAGNOSIS — E1151 Type 2 diabetes mellitus with diabetic peripheral angiopathy without gangrene: Secondary | ICD-10-CM | POA: Diagnosis not present

## 2019-07-16 DIAGNOSIS — Z0000 Encounter for general adult medical examination without abnormal findings: Secondary | ICD-10-CM | POA: Diagnosis not present

## 2020-01-09 DIAGNOSIS — E1151 Type 2 diabetes mellitus with diabetic peripheral angiopathy without gangrene: Secondary | ICD-10-CM | POA: Diagnosis not present

## 2020-01-15 ENCOUNTER — Other Ambulatory Visit: Payer: Self-pay | Admitting: Internal Medicine

## 2020-01-15 DIAGNOSIS — E1151 Type 2 diabetes mellitus with diabetic peripheral angiopathy without gangrene: Secondary | ICD-10-CM | POA: Diagnosis not present

## 2020-01-15 DIAGNOSIS — J431 Panlobular emphysema: Secondary | ICD-10-CM | POA: Diagnosis not present

## 2020-01-15 DIAGNOSIS — E782 Mixed hyperlipidemia: Secondary | ICD-10-CM | POA: Diagnosis not present

## 2020-01-31 ENCOUNTER — Other Ambulatory Visit: Payer: Self-pay | Admitting: Internal Medicine

## 2020-03-10 ENCOUNTER — Other Ambulatory Visit: Payer: Self-pay | Admitting: Internal Medicine

## 2020-04-08 ENCOUNTER — Other Ambulatory Visit: Payer: Self-pay | Admitting: Dentistry

## 2020-04-09 ENCOUNTER — Other Ambulatory Visit: Payer: Self-pay | Admitting: Internal Medicine

## 2020-04-09 DIAGNOSIS — Z961 Presence of intraocular lens: Secondary | ICD-10-CM | POA: Diagnosis not present

## 2020-04-22 ENCOUNTER — Other Ambulatory Visit: Payer: Self-pay | Admitting: Internal Medicine

## 2020-05-02 ENCOUNTER — Other Ambulatory Visit: Payer: Self-pay | Admitting: Internal Medicine

## 2020-06-09 ENCOUNTER — Other Ambulatory Visit: Payer: Self-pay | Admitting: Internal Medicine

## 2020-07-02 ENCOUNTER — Other Ambulatory Visit: Payer: Self-pay | Admitting: Internal Medicine

## 2020-07-10 DIAGNOSIS — E782 Mixed hyperlipidemia: Secondary | ICD-10-CM | POA: Diagnosis not present

## 2020-07-10 DIAGNOSIS — E1151 Type 2 diabetes mellitus with diabetic peripheral angiopathy without gangrene: Secondary | ICD-10-CM | POA: Diagnosis not present

## 2020-07-16 ENCOUNTER — Other Ambulatory Visit: Payer: Self-pay | Admitting: Internal Medicine

## 2020-07-16 DIAGNOSIS — Z30431 Encounter for routine checking of intrauterine contraceptive device: Secondary | ICD-10-CM | POA: Diagnosis not present

## 2020-07-16 DIAGNOSIS — E782 Mixed hyperlipidemia: Secondary | ICD-10-CM | POA: Diagnosis not present

## 2020-07-16 DIAGNOSIS — Z23 Encounter for immunization: Secondary | ICD-10-CM | POA: Diagnosis not present

## 2020-07-16 DIAGNOSIS — I200 Unstable angina: Secondary | ICD-10-CM | POA: Diagnosis not present

## 2020-07-16 DIAGNOSIS — E1151 Type 2 diabetes mellitus with diabetic peripheral angiopathy without gangrene: Secondary | ICD-10-CM | POA: Diagnosis not present

## 2020-07-16 DIAGNOSIS — J431 Panlobular emphysema: Secondary | ICD-10-CM | POA: Diagnosis not present

## 2020-07-16 DIAGNOSIS — Z0000 Encounter for general adult medical examination without abnormal findings: Secondary | ICD-10-CM | POA: Diagnosis not present

## 2020-08-12 ENCOUNTER — Other Ambulatory Visit: Payer: Self-pay | Admitting: Internal Medicine

## 2020-09-01 ENCOUNTER — Other Ambulatory Visit: Payer: Self-pay | Admitting: Dentistry

## 2020-09-17 ENCOUNTER — Other Ambulatory Visit: Payer: Self-pay | Admitting: Internal Medicine

## 2020-09-20 ENCOUNTER — Other Ambulatory Visit: Payer: Self-pay

## 2020-09-20 MED FILL — Ibuprofen Tab 800 MG: ORAL | 90 days supply | Qty: 270 | Fill #0 | Status: AC

## 2020-09-22 ENCOUNTER — Other Ambulatory Visit: Payer: Self-pay

## 2020-09-22 MED FILL — Alprazolam Tab 0.5 MG: ORAL | 30 days supply | Qty: 90 | Fill #0 | Status: AC

## 2020-09-23 MED FILL — Dulaglutide Soln Auto-injector 0.75 MG/0.5ML: SUBCUTANEOUS | 28 days supply | Qty: 2 | Fill #0 | Status: AC

## 2020-09-24 ENCOUNTER — Other Ambulatory Visit: Payer: Self-pay

## 2020-09-30 ENCOUNTER — Other Ambulatory Visit: Payer: Self-pay | Admitting: Internal Medicine

## 2020-10-02 ENCOUNTER — Other Ambulatory Visit: Payer: Self-pay

## 2020-10-08 ENCOUNTER — Other Ambulatory Visit: Payer: Self-pay | Admitting: Internal Medicine

## 2020-10-08 ENCOUNTER — Other Ambulatory Visit: Payer: Self-pay

## 2020-10-08 MED FILL — Metoprolol Succinate Tab ER 24HR 100 MG (Tartrate Equiv): ORAL | 90 days supply | Qty: 180 | Fill #0 | Status: AC

## 2020-10-09 ENCOUNTER — Other Ambulatory Visit: Payer: Self-pay

## 2020-10-10 ENCOUNTER — Other Ambulatory Visit: Payer: Self-pay | Admitting: Internal Medicine

## 2020-10-10 ENCOUNTER — Other Ambulatory Visit: Payer: Self-pay

## 2020-10-11 MED FILL — Albuterol Sulfate Inhal Aero 108 MCG/ACT (90MCG Base Equiv): RESPIRATORY_TRACT | 25 days supply | Qty: 8.5 | Fill #0 | Status: AC

## 2020-10-12 ENCOUNTER — Other Ambulatory Visit: Payer: Self-pay

## 2020-10-13 ENCOUNTER — Other Ambulatory Visit: Payer: Self-pay

## 2020-10-13 MED ORDER — BUTALBITAL-ASPIRIN-CAFFEINE 50-325-40 MG PO CAPS
ORAL_CAPSULE | ORAL | 5 refills | Status: AC
  Filled 2020-10-13: qty 30, 7d supply, fill #0
  Filled 2020-11-10: qty 30, 7d supply, fill #1
  Filled 2020-12-01: qty 30, 7d supply, fill #2
  Filled 2020-12-18: qty 30, 7d supply, fill #3
  Filled 2020-12-31: qty 12, 3d supply, fill #4
  Filled 2021-01-01: qty 18, 4d supply, fill #4
  Filled 2021-01-22: qty 30, 7d supply, fill #5

## 2020-10-13 MED ORDER — PROMETHAZINE HCL 25 MG PO TABS
ORAL_TABLET | ORAL | 2 refills | Status: AC
  Filled 2020-10-13: qty 120, 30d supply, fill #0
  Filled 2020-11-10: qty 120, 30d supply, fill #1
  Filled 2020-12-18: qty 120, 30d supply, fill #2
  Filled 2021-02-02: qty 120, 30d supply, fill #3

## 2020-10-14 ENCOUNTER — Other Ambulatory Visit: Payer: Self-pay

## 2020-10-15 ENCOUNTER — Other Ambulatory Visit: Payer: Self-pay

## 2020-10-15 MED ORDER — BUTALBITAL-ASA-CAFF-CODEINE 50-325-40-30 MG PO CAPS: ORAL_CAPSULE | ORAL | 5 refills | Status: AC

## 2020-10-19 ENCOUNTER — Other Ambulatory Visit: Payer: Self-pay | Admitting: Internal Medicine

## 2020-10-20 ENCOUNTER — Other Ambulatory Visit: Payer: Self-pay

## 2020-10-20 MED FILL — Sertraline HCl Tab 100 MG: ORAL | 60 days supply | Qty: 60 | Fill #0 | Status: AC

## 2020-10-21 ENCOUNTER — Other Ambulatory Visit: Payer: Self-pay

## 2020-10-22 ENCOUNTER — Other Ambulatory Visit: Payer: Self-pay

## 2020-10-22 MED FILL — Bupropion HCl Tab ER 24HR 150 MG: ORAL | 90 days supply | Qty: 90 | Fill #0 | Status: AC

## 2020-10-22 MED FILL — Rosuvastatin Calcium Tab 5 MG: ORAL | 90 days supply | Qty: 90 | Fill #0 | Status: AC

## 2020-10-22 MED FILL — Lansoprazole Cap Delayed Release 30 MG: ORAL | 90 days supply | Qty: 180 | Fill #0 | Status: CN

## 2020-10-23 ENCOUNTER — Other Ambulatory Visit: Payer: Self-pay

## 2020-10-23 MED FILL — Colchicine Tab 0.6 MG: ORAL | 30 days supply | Qty: 30 | Fill #0 | Status: AC

## 2020-10-24 ENCOUNTER — Other Ambulatory Visit: Payer: Self-pay

## 2020-10-24 MED ORDER — ALPRAZOLAM 0.5 MG PO TABS
0.5000 mg | ORAL_TABLET | Freq: Three times a day (TID) | ORAL | 5 refills | Status: AC
  Filled 2020-10-24: qty 90, 30d supply, fill #0
  Filled 2020-11-20: qty 90, 30d supply, fill #1
  Filled 2020-12-18: qty 90, 30d supply, fill #2
  Filled 2021-01-13: qty 90, 30d supply, fill #3

## 2020-10-28 ENCOUNTER — Other Ambulatory Visit: Payer: Self-pay

## 2020-11-07 ENCOUNTER — Other Ambulatory Visit: Payer: Self-pay

## 2020-11-07 MED FILL — Clobetasol Propionate Oint 0.05%: CUTANEOUS | 15 days supply | Qty: 30 | Fill #0 | Status: AC

## 2020-11-07 MED FILL — Montelukast Sodium Tab 10 MG (Base Equiv): ORAL | 90 days supply | Qty: 90 | Fill #0 | Status: AC

## 2020-11-10 ENCOUNTER — Other Ambulatory Visit: Payer: Self-pay

## 2020-11-10 MED FILL — Albuterol Sulfate Inhal Aero 108 MCG/ACT (90MCG Base Equiv): RESPIRATORY_TRACT | 25 days supply | Qty: 8.5 | Fill #1 | Status: AC

## 2020-11-10 MED FILL — Dulaglutide Soln Auto-injector 0.75 MG/0.5ML: SUBCUTANEOUS | 28 days supply | Qty: 2 | Fill #1 | Status: AC

## 2020-11-11 ENCOUNTER — Other Ambulatory Visit: Payer: Self-pay

## 2020-11-12 ENCOUNTER — Other Ambulatory Visit: Payer: Self-pay

## 2020-11-20 ENCOUNTER — Other Ambulatory Visit: Payer: Self-pay

## 2020-11-20 MED FILL — Colchicine Tab 0.6 MG: ORAL | 30 days supply | Qty: 30 | Fill #1 | Status: AC

## 2020-12-01 ENCOUNTER — Other Ambulatory Visit: Payer: Self-pay

## 2020-12-02 ENCOUNTER — Other Ambulatory Visit: Payer: Self-pay

## 2020-12-02 MED FILL — Albuterol Sulfate Inhal Aero 108 MCG/ACT (90MCG Base Equiv): RESPIRATORY_TRACT | 25 days supply | Qty: 8.5 | Fill #2 | Status: AC

## 2020-12-08 ENCOUNTER — Other Ambulatory Visit: Payer: Self-pay | Admitting: Internal Medicine

## 2020-12-08 ENCOUNTER — Other Ambulatory Visit: Payer: Self-pay

## 2020-12-08 DIAGNOSIS — Z1231 Encounter for screening mammogram for malignant neoplasm of breast: Secondary | ICD-10-CM

## 2020-12-08 MED ORDER — ZOSTER VAC RECOMB ADJUVANTED 50 MCG/0.5ML IM SUSR
INTRAMUSCULAR | 1 refills | Status: AC
  Filled 2020-12-08: qty 0.5, 1d supply, fill #0

## 2020-12-18 ENCOUNTER — Other Ambulatory Visit: Payer: Self-pay

## 2020-12-18 MED FILL — Sertraline HCl Tab 100 MG: ORAL | 60 days supply | Qty: 60 | Fill #1 | Status: AC

## 2020-12-18 MED FILL — Colchicine Tab 0.6 MG: ORAL | 30 days supply | Qty: 30 | Fill #2 | Status: AC

## 2020-12-28 MED FILL — Dulaglutide Soln Auto-injector 0.75 MG/0.5ML: SUBCUTANEOUS | 28 days supply | Qty: 2 | Fill #2 | Status: AC

## 2020-12-29 ENCOUNTER — Other Ambulatory Visit: Payer: Self-pay

## 2020-12-30 ENCOUNTER — Other Ambulatory Visit: Payer: Self-pay

## 2020-12-30 MED FILL — Albuterol Sulfate Inhal Aero 108 MCG/ACT (90MCG Base Equiv): RESPIRATORY_TRACT | 25 days supply | Qty: 8.5 | Fill #3 | Status: AC

## 2020-12-31 MED FILL — Lansoprazole Cap Delayed Release 30 MG: ORAL | 90 days supply | Qty: 180 | Fill #0 | Status: AC

## 2021-01-01 ENCOUNTER — Other Ambulatory Visit: Payer: Self-pay

## 2021-01-02 ENCOUNTER — Other Ambulatory Visit: Payer: Self-pay

## 2021-01-07 ENCOUNTER — Other Ambulatory Visit: Payer: Self-pay

## 2021-01-07 DIAGNOSIS — E1151 Type 2 diabetes mellitus with diabetic peripheral angiopathy without gangrene: Secondary | ICD-10-CM | POA: Diagnosis not present

## 2021-01-07 DIAGNOSIS — E782 Mixed hyperlipidemia: Secondary | ICD-10-CM | POA: Diagnosis not present

## 2021-01-07 MED ORDER — HYDROCODONE-ACETAMINOPHEN 7.5-325 MG PO TABS
1.0000 | ORAL_TABLET | ORAL | 0 refills | Status: DC
  Filled 2021-01-07: qty 15, 3d supply, fill #0

## 2021-01-07 MED ORDER — CLINDAMYCIN HCL 300 MG PO CAPS
300.0000 mg | ORAL_CAPSULE | Freq: Four times a day (QID) | ORAL | 0 refills | Status: AC
  Filled 2021-01-07: qty 28, 7d supply, fill #0

## 2021-01-12 ENCOUNTER — Other Ambulatory Visit: Payer: Self-pay

## 2021-01-12 MED ORDER — HYDROCODONE-ACETAMINOPHEN 7.5-325 MG PO TABS
ORAL_TABLET | ORAL | 0 refills | Status: AC
  Filled 2021-01-12: qty 15, 4d supply, fill #0

## 2021-01-14 ENCOUNTER — Other Ambulatory Visit: Payer: Self-pay

## 2021-01-15 ENCOUNTER — Other Ambulatory Visit: Payer: Self-pay

## 2021-01-22 MED FILL — Albuterol Sulfate Inhal Aero 108 MCG/ACT (90MCG Base Equiv): RESPIRATORY_TRACT | 25 days supply | Qty: 8.5 | Fill #4 | Status: AC

## 2021-01-23 ENCOUNTER — Other Ambulatory Visit: Payer: Self-pay

## 2021-02-02 ENCOUNTER — Other Ambulatory Visit: Payer: Self-pay

## 2021-02-02 MED FILL — Ibuprofen Tab 800 MG: ORAL | 90 days supply | Qty: 270 | Fill #1 | Status: AC

## 2021-02-02 MED FILL — Dulaglutide Soln Auto-injector 0.75 MG/0.5ML: SUBCUTANEOUS | 28 days supply | Qty: 2 | Fill #3 | Status: AC

## 2021-02-04 ENCOUNTER — Other Ambulatory Visit: Payer: Self-pay

## 2021-02-04 DIAGNOSIS — E1151 Type 2 diabetes mellitus with diabetic peripheral angiopathy without gangrene: Secondary | ICD-10-CM | POA: Diagnosis not present

## 2021-02-04 DIAGNOSIS — I2510 Atherosclerotic heart disease of native coronary artery without angina pectoris: Secondary | ICD-10-CM | POA: Diagnosis not present

## 2021-02-04 DIAGNOSIS — F330 Major depressive disorder, recurrent, mild: Secondary | ICD-10-CM | POA: Diagnosis not present

## 2021-02-04 DIAGNOSIS — M1000 Idiopathic gout, unspecified site: Secondary | ICD-10-CM | POA: Diagnosis not present

## 2021-02-04 DIAGNOSIS — J431 Panlobular emphysema: Secondary | ICD-10-CM | POA: Diagnosis not present

## 2021-02-13 ENCOUNTER — Other Ambulatory Visit: Payer: Self-pay

## 2021-02-19 DEATH — deceased

## 2021-06-26 ENCOUNTER — Other Ambulatory Visit: Payer: Self-pay

## 2021-12-03 ENCOUNTER — Other Ambulatory Visit: Payer: Self-pay | Admitting: Internal Medicine

## 2021-12-03 DIAGNOSIS — Z1231 Encounter for screening mammogram for malignant neoplasm of breast: Secondary | ICD-10-CM
